# Patient Record
Sex: Male | Born: 1980 | Race: Black or African American | Hispanic: No | Marital: Married
Health system: Southern US, Academic
[De-identification: ages and names within clinical notes are randomized; demographics above are authoritative.]

## PROBLEM LIST (undated history)

## (undated) ENCOUNTER — Encounter

## (undated) ENCOUNTER — Encounter
Attending: Pharmacist Clinician (PhC)/ Clinical Pharmacy Specialist | Primary: Pharmacist Clinician (PhC)/ Clinical Pharmacy Specialist

## (undated) ENCOUNTER — Ambulatory Visit: Attending: Internal Medicine | Primary: Internal Medicine

## (undated) ENCOUNTER — Ambulatory Visit

## (undated) ENCOUNTER — Telehealth

## (undated) ENCOUNTER — Telehealth
Attending: Pharmacist Clinician (PhC)/ Clinical Pharmacy Specialist | Primary: Pharmacist Clinician (PhC)/ Clinical Pharmacy Specialist

## (undated) ENCOUNTER — Encounter: Attending: Family | Primary: Family

## (undated) ENCOUNTER — Telehealth: Attending: Family | Primary: Family

## (undated) ENCOUNTER — Encounter: Attending: Gastroenterology | Primary: Gastroenterology

## (undated) ENCOUNTER — Emergency Department (HOSPITAL_BASED_OUTPATIENT_CLINIC_OR_DEPARTMENT_OTHER): Admission: EM | Payer: Self-pay | Source: Home / Self Care

## (undated) DIAGNOSIS — I10 Essential (primary) hypertension: Secondary | ICD-10-CM

## (undated) DIAGNOSIS — K7400 Hepatic fibrosis, unspecified: Secondary | ICD-10-CM

## (undated) DIAGNOSIS — A048 Other specified bacterial intestinal infections: Secondary | ICD-10-CM

## (undated) DIAGNOSIS — B1910 Unspecified viral hepatitis B without hepatic coma: Secondary | ICD-10-CM

## (undated) HISTORY — DX: Other specified bacterial intestinal infections: A04.8

## (undated) HISTORY — DX: Hepatic fibrosis: K74.0

## (undated) HISTORY — PX: NO PAST SURGERIES: SHX2092

## (undated) HISTORY — DX: Hepatic fibrosis, unspecified: K74.00

## (undated) HISTORY — DX: Unspecified viral hepatitis B without hepatic coma: B19.10

---

## 2005-06-11 ENCOUNTER — Encounter: Admission: RE | Admit: 2005-06-11 | Discharge: 2005-06-11 | Payer: Self-pay | Admitting: Internal Medicine

## 2005-09-17 ENCOUNTER — Ambulatory Visit: Payer: Self-pay | Admitting: Gastroenterology

## 2005-12-11 ENCOUNTER — Ambulatory Visit: Payer: Self-pay | Admitting: Gastroenterology

## 2006-02-16 ENCOUNTER — Encounter: Admission: RE | Admit: 2006-02-16 | Discharge: 2006-02-16 | Payer: Self-pay | Admitting: Internal Medicine

## 2006-05-21 ENCOUNTER — Ambulatory Visit: Payer: Self-pay | Admitting: Gastroenterology

## 2006-05-28 ENCOUNTER — Ambulatory Visit (HOSPITAL_COMMUNITY): Admission: RE | Admit: 2006-05-28 | Discharge: 2006-05-28 | Payer: Self-pay | Admitting: Gastroenterology

## 2006-11-02 ENCOUNTER — Ambulatory Visit: Payer: Self-pay | Admitting: Gastroenterology

## 2006-12-17 ENCOUNTER — Ambulatory Visit (HOSPITAL_COMMUNITY): Admission: RE | Admit: 2006-12-17 | Discharge: 2006-12-17 | Payer: Self-pay | Admitting: Gastroenterology

## 2007-06-21 ENCOUNTER — Ambulatory Visit: Payer: Self-pay | Admitting: Gastroenterology

## 2007-07-08 ENCOUNTER — Emergency Department (HOSPITAL_COMMUNITY): Admission: EM | Admit: 2007-07-08 | Discharge: 2007-07-08 | Payer: Self-pay | Admitting: Emergency Medicine

## 2007-08-01 ENCOUNTER — Ambulatory Visit (HOSPITAL_COMMUNITY): Admission: RE | Admit: 2007-08-01 | Discharge: 2007-08-01 | Payer: Self-pay | Admitting: Gastroenterology

## 2008-03-08 ENCOUNTER — Ambulatory Visit: Payer: Self-pay | Admitting: Gastroenterology

## 2010-03-18 ENCOUNTER — Ambulatory Visit (HOSPITAL_BASED_OUTPATIENT_CLINIC_OR_DEPARTMENT_OTHER): Admission: RE | Admit: 2010-03-18 | Discharge: 2010-03-18 | Payer: Self-pay | Admitting: Internal Medicine

## 2010-03-18 ENCOUNTER — Ambulatory Visit: Payer: Self-pay | Admitting: Diagnostic Radiology

## 2010-06-02 ENCOUNTER — Emergency Department (HOSPITAL_COMMUNITY): Admission: EM | Admit: 2010-06-02 | Discharge: 2010-06-02 | Payer: Self-pay | Admitting: Emergency Medicine

## 2010-06-10 ENCOUNTER — Ambulatory Visit: Payer: Self-pay | Admitting: Oncology

## 2010-07-03 ENCOUNTER — Emergency Department (HOSPITAL_BASED_OUTPATIENT_CLINIC_OR_DEPARTMENT_OTHER)
Admission: EM | Admit: 2010-07-03 | Discharge: 2010-07-03 | Payer: Self-pay | Source: Home / Self Care | Admitting: Emergency Medicine

## 2011-10-31 ENCOUNTER — Ambulatory Visit (INDEPENDENT_AMBULATORY_CARE_PROVIDER_SITE_OTHER): Payer: 59 | Admitting: Internal Medicine

## 2011-10-31 VITALS — BP 134/95 | HR 76 | Temp 98.5°F | Resp 18 | Ht 69.0 in | Wt 212.0 lb

## 2011-10-31 DIAGNOSIS — N342 Other urethritis: Secondary | ICD-10-CM

## 2011-10-31 DIAGNOSIS — Z6831 Body mass index (BMI) 31.0-31.9, adult: Secondary | ICD-10-CM

## 2011-10-31 DIAGNOSIS — N341 Nonspecific urethritis: Secondary | ICD-10-CM

## 2011-10-31 LAB — POCT URINALYSIS DIPSTICK
Bilirubin, UA: NEGATIVE
Blood, UA: NEGATIVE
Glucose, UA: NEGATIVE
Ketones, UA: NEGATIVE
Leukocytes, UA: NEGATIVE
Nitrite, UA: NEGATIVE
Urobilinogen, UA: 0.2
pH, UA: 8.5

## 2011-10-31 LAB — POCT UA - MICROSCOPIC ONLY
Bacteria, U Microscopic: NEGATIVE
Casts, Ur, LPF, POC: NEGATIVE
Crystals, Ur, HPF, POC: NEGATIVE
Mucus, UA: NEGATIVE
RBC, urine, microscopic: NEGATIVE
Yeast, UA: NEGATIVE

## 2011-10-31 MED ORDER — DOXYCYCLINE HYCLATE 100 MG PO TABS: 100.0000 mg | ORAL_TABLET | Freq: Two times a day (BID) | ORAL | Status: AC

## 2011-10-31 NOTE — Progress Notes (Signed)
  Subjective:    Patient ID: Isaac Brown, male    DOB: 18-Sep-1980, 31 y.o.   MRN: [id number]  HPIShe is a 10 day history of a burning sensation in the distal urethra that increases after urination and with ejaculation/he is married and has had no other partners in the last 5-7 years/there's no discharge/there's no urinary frequency/no hematuria/no testicular pain/no skin lesions/no systemic arthritis    Review of Systems     Objective:   Physical Exam Vital signs are stable The skin of the penis is clear/testicles nontender/the meatus is clear/there is no discharge/there is no specific tenderness in the shaft      Results for orders placed in visit on 10/31/11  POCT UA - MICROSCOPIC ONLY      Component Value Range   WBC, Ur, HPF, POC 0-1     RBC, urine, microscopic negative     Bacteria, U Microscopic negative     Mucus, UA negative     Epithelial cells, urine per micros 0-2     Crystals, Ur, HPF, POC negative     Casts, Ur, LPF, POC negative     Yeast, UA negative    POCT URINALYSIS DIPSTICK      Component Value Range   Color, UA yellow     Clarity, UA clear     Glucose, UA negative     Bilirubin, UA negative     Ketones, UA negative     Spec Grav, UA 1.015     Blood, UA negative     pH, UA 8.5     Protein, UA 30     Urobilinogen, UA 0.2     Nitrite, UA negative     Leukocytes, UA Negative      Assessment & Plan:  Problem #1 urethritis of unclear etiology Doxycycline 100 twice a day #20 Send URiprobe If all is negative and he has no response to medication for a nonspecific cause, we will set up an appointment for urology

## 2011-11-04 LAB — GC/CHLAMYDIA PROBE AMP, URINE
Chlamydia, Swab/Urine, PCR: NEGATIVE
GC Probe Amp, Urine: NEGATIVE

## 2012-02-18 ENCOUNTER — Ambulatory Visit (INDEPENDENT_AMBULATORY_CARE_PROVIDER_SITE_OTHER): Payer: 59 | Admitting: Family Medicine

## 2012-02-18 ENCOUNTER — Other Ambulatory Visit: Payer: Self-pay | Admitting: Family Medicine

## 2012-02-18 VITALS — BP 132/84 | HR 88 | Temp 98.2°F | Resp 18 | Ht 69.5 in | Wt 214.0 lb

## 2012-02-18 DIAGNOSIS — J029 Acute pharyngitis, unspecified: Secondary | ICD-10-CM

## 2012-02-18 DIAGNOSIS — B1910 Unspecified viral hepatitis B without hepatic coma: Secondary | ICD-10-CM

## 2012-02-18 LAB — POCT CBC
Granulocyte percent: 49.2 %G (ref 37–80)
HCT, POC: 49.8 % (ref 43.5–53.7)
Hemoglobin: 15.6 g/dL (ref 14.1–18.1)
Lymph, poc: 1.4 (ref 0.6–3.4)
MCH, POC: 28 pg (ref 27–31.2)
MCHC: 31.3 g/dL — AB (ref 31.8–35.4)
MCV: 89.2 fL (ref 80–97)
MPV: 10.1 fL (ref 0–99.8)
POC Granulocyte: 1.8 — AB (ref 2–6.9)
POC LYMPH PERCENT: 39.4 %L (ref 10–50)
Platelet Count, POC: 206 10*3/uL (ref 142–424)
RBC: 5.58 M/uL (ref 4.69–6.13)
RDW, POC: 14 %
WBC: 3.6 10*3/uL — AB (ref 4.6–10.2)

## 2012-02-18 LAB — COMPREHENSIVE METABOLIC PANEL
ALT: 31 U/L (ref 0–53)
AST: 29 U/L (ref 0–37)
Albumin: 4.5 g/dL (ref 3.5–5.2)
Alkaline Phosphatase: 47 U/L (ref 39–117)
BUN: 12 mg/dL (ref 6–23)
Calcium: 9.3 mg/dL (ref 8.4–10.5)
Creat: 1.14 mg/dL (ref 0.50–1.35)
Glucose, Bld: 99 mg/dL (ref 70–99)
Sodium: 138 mEq/L (ref 135–145)
Total Bilirubin: 1.9 mg/dL — ABNORMAL HIGH (ref 0.3–1.2)
Total Protein: 7.3 g/dL (ref 6.0–8.3)

## 2012-02-18 LAB — POCT RAPID STREP A (OFFICE): Rapid Strep A Screen: NEGATIVE

## 2012-02-18 NOTE — Progress Notes (Signed)
[facility] [address] [place] [address] [phone]  Date:  02/18/2012   Name:  Isaac Brown   DOB:  Jul 21, 1981   MRN:  [id number]  PCP:  No primary provider on file.    Chief Complaint: Sore Throat and Fatigue   History of Present Illness:  Isaac Brown is a 31 y.o. very pleasant male patient who presents with the following:  He has noted a waxing and waning ST for 2 or 3 weeks.  He has not noted a fever, no chills, no aches.  He has felt tired. He has noted some ear aches, occasional cough with phelegm. No GI symptoms.    He does have chronic Hep B.  He was being followed by hepatology for some time, but has not been seen in about a year.  He contracted hep B through vertical transmission  Patient Active Problem List  Diagnosis  . BMI 31.0-31.9,adult    No past medical history on file.  No past surgical history on file.  History  Substance Use Topics  . Smoking status: Never Smoker   . Smokeless tobacco: Not on file  . Alcohol Use: Not on file    No family history on file.  No Known Allergies  Medication list has been reviewed and updated.  Current Outpatient Prescriptions on File Prior to Visit  Medication Sig Dispense Refill  . atenolol (TENORMIN) 25 MG tablet Take 25 mg by mouth daily.        Review of Systems:  As per HPI- otherwise negative.   Physical Examination: Filed Vitals:   02/18/12 0933  BP: 132/84  Pulse: 88  Temp: 98.2 F (36.8 C)  Resp: 18   Filed Vitals:   02/18/12 0933  Height: 5' 9.5" (1.765 m)  Weight: 214 lb (97.07 kg)   Body mass index is 31.15 kg/(m^2). Ideal Body Weight: Weight in (lb) to have BMI = 25: 171.4   GEN: WDWN, NAD, Non-toxic, A & O x 3 HEENT: Atraumatic, Normocephalic. Neck supple. No masses, No LAD.  Mild scleral icterus, oropharynx wnl, TM wnl, PEERL, EOMI Ears and Nose: No external deformity. CV: RRR, No M/G/R. No JVD. No thrill. No extra heart sounds. PULM: CTA B, no  wheezes, crackles, rhonchi. No retractions. No resp. distress. No accessory muscle use. ABD: S, NT, ND, +BS. No rebound. No HSM. EXTR: No c/c/e NEURO Normal gait.  PSYCH: Normally interactive. Conversant. Not depressed or anxious appearing.  Calm demeanor.   Results for orders placed in visit on 02/18/12  POCT CBC      Component Value Range   WBC 3.6 (*) 4.6 - 10.2 K/uL   Lymph, poc 1.4  0.6 - 3.4   POC LYMPH PERCENT 39.4  10 - 50 %L   MID (cbc) 0.4  0 - 0.9   POC MID % 11.4  0 - 12 %M   POC Granulocyte 1.8 (*) 2 - 6.9   Granulocyte percent 49.2  37 - 80 %G   RBC 5.58  4.69 - 6.13 M/uL   Hemoglobin 15.6  14.1 - 18.1 g/dL   HCT, POC 29.5  62.1 - 53.7 %   MCV 89.2  80 - 97 fL   MCH, POC 28.0  27 - 31.2 pg   MCHC 31.3 (*) 31.8 - 35.4 g/dL   RDW, POC 30.8     Platelet Count, POC 206  142 - 424 K/uL   MPV 10.1  0 - 99.8 fL  POCT RAPID STREP A (OFFICE)      Component Value Range   Rapid Strep A Screen Negative  Negative      Assessment and Plan: 1. Sore throat  POCT CBC, POCT rapid strep A, Epstein-Barr virus VCA antibody panel [[facility]], Culture, Group A Strep  2. Hepatitis B  Comprehensive metabolic panel   Suspect a viral ST- went over supportive measures.  Will contact with EBV and throat culture results. I will get him back in with ID or hepatology once results received.    Abbe Amsterdam, MD

## 2012-02-19 LAB — EPSTEIN-BARR VIRUS VCA ANTIBODY PANEL
EBV EA IgG: 20.2 U/mL — ABNORMAL HIGH (ref <9.0)
EBV NA IgG: 186 U/mL — ABNORMAL HIGH (ref <18.0)
EBV VCA IgG: 663 U/mL — ABNORMAL HIGH (ref <18.0)

## 2012-02-20 LAB — CULTURE, GROUP A STREP: Organism ID, Bacteria: NORMAL

## 2012-02-21 LAB — BILIRUBIN,DIRECT & INDIRECT (FRACTIONATED): Bilirubin, Direct: 0.3 mg/dL (ref 0.0–0.3)

## 2012-02-21 LAB — BILIRUBIN, TOTAL: Total Bilirubin: 1.9 mg/dL — ABNORMAL HIGH (ref 0.3–1.2)

## 2012-02-23 ENCOUNTER — Encounter: Payer: Self-pay | Admitting: Family Medicine

## 2012-02-23 NOTE — Addendum Note (Signed)
Addended by: Abbe Amsterdam C on: 02/23/2012 01:43 PM   Modules accepted: Orders

## 2012-03-15 ENCOUNTER — Encounter: Payer: Self-pay | Admitting: Internal Medicine

## 2012-03-15 ENCOUNTER — Ambulatory Visit (INDEPENDENT_AMBULATORY_CARE_PROVIDER_SITE_OTHER): Payer: Self-pay | Admitting: Internal Medicine

## 2012-03-15 VITALS — BP 145/97 | HR 57 | Temp 98.0°F | Wt 217.0 lb

## 2012-03-15 DIAGNOSIS — I10 Essential (primary) hypertension: Secondary | ICD-10-CM

## 2012-03-15 DIAGNOSIS — B181 Chronic viral hepatitis B without delta-agent: Secondary | ICD-10-CM

## 2012-03-15 LAB — HEPATITIS B SURFACE ANTIBODY,QUALITATIVE: Hep B S Ab: NEGATIVE

## 2012-03-15 LAB — CBC WITH DIFFERENTIAL/PLATELET
Basophils Absolute: 0 10*3/uL (ref 0.0–0.1)
Eosinophils Absolute: 0.1 10*3/uL (ref 0.0–0.7)
Hemoglobin: 15.8 g/dL (ref 13.0–17.0)
Lymphocytes Relative: 48 % — ABNORMAL HIGH (ref 12–46)
Lymphs Abs: 1.4 10*3/uL (ref 0.7–4.0)
MCH: 28.7 pg (ref 26.0–34.0)
MCV: 82.4 fL (ref 78.0–100.0)
Monocytes Absolute: 0.3 10*3/uL (ref 0.1–1.0)
Monocytes Relative: 11 % (ref 3–12)
Neutrophils Relative %: 38 % — ABNORMAL LOW (ref 43–77)
Platelets: 170 10*3/uL (ref 150–400)
RBC: 5.5 MIL/uL (ref 4.22–5.81)
RDW: 14.3 % (ref 11.5–15.5)
WBC: 2.9 10*3/uL — ABNORMAL LOW (ref 4.0–10.5)

## 2012-03-15 LAB — HIV ANTIBODY (ROUTINE TESTING W REFLEX): HIV: NONREACTIVE

## 2012-03-15 LAB — HEPATITIS C ANTIBODY: HCV Ab: NEGATIVE

## 2012-03-15 NOTE — Progress Notes (Signed)
INFECTIOUS DISEASES CLINIC  RFV: hep B management Subjective:    Patient ID: Isaac Brown, male    DOB: 02-03-1981, 31 y.o.   MRN: [id number]  HPI Isaac Brown is a 31 yo MAle, originally from [place], moved to [place] 10 years ago. Who has history of hepatitis B, and hypertension. He was referred by his PCP. He states that he occasionally feels fatigue. occ bilateral flank pain. Increased weight 7 lb over the last 3-4 months. In setting of stopping working out since back to school. He reports having episode of scleral icterus. He was diagnosed while in high school, while trying to donate blood in [place].  Current Outpatient Prescriptions on File Prior to Visit  Medication Sig Dispense Refill  . atenolol (TENORMIN) 25 MG tablet Take 25 mg by mouth daily.       Active Ambulatory Problems    Diagnosis Date Noted  . BMI 31.0-31.9,adult 10/31/2011   Resolved Ambulatory Problems    Diagnosis Date Noted  . No Resolved Ambulatory Problems   No Additional Past Medical History     Soc hx: works as Estate agent. Separated now, but wife and 5yo daughter in [place]. Also sister lives in [place]. Going school at [employer] in [place]. No smoking or drinking  family history is not on file.  Review of Systems     Objective:   Physical Exam BP 145/97  Pulse 57  Temp 98 F (36.7 C) (Oral)  Wt 217 lb (98.431 kg) Physical Exam  Constitutional: He is oriented to person, place, and time. He appears well-developed and well-nourished. No distress.  HENT:  Mouth/Throat: Oropharynx is clear and moist. No oropharyngeal exudate.  Cardiovascular: Normal rate, regular rhythm and normal heart sounds. Exam reveals no gallop and no friction rub.  No murmur heard.  Pulmonary/Chest: Effort normal and breath sounds normal. No respiratory distress. He has no wheezes.  Abdominal: Soft. Bowel sounds are normal. He exhibits no distension. There is no tenderness.  Lymphadenopathy:  He has no cervical adenopathy.    Neurological: He is alert and oriented to person, place, and time.  Skin: Skin is warm and dry. No rash noted. No erythema.  Psychiatric: He has a normal mood and affect. His behavior is normal.   Labs: hepBsAg POSITIVE hepBcAb POSITIVE Hep BsAb NEGATIVE Hep A Ab POSITIVE Hep C NEGATIVE HIV NEGATIVE     Assessment & Plan:  History of chronic hepatitis B= no records, will check the following labs:  hep a, hep b, hep c, and hiv. Will check hep B viral load if needed. May need to get liver ultrasound but will wait to see back results  Addendum. Will check Hep B VL, and will arrange for RUQ ultrasound with dopplers. Pending these results will see if we need to do patient assistance program to get access to antiviral therapy  rtc in 4 wks

## 2012-03-16 LAB — HEPATITIS A ANTIBODY, TOTAL: Hep A Total Ab: POSITIVE — AB

## 2012-03-16 LAB — HEPATITIS B CORE ANTIBODY, TOTAL: Hep B Core Total Ab: POSITIVE — AB

## 2012-03-17 ENCOUNTER — Ambulatory Visit: Payer: Self-pay | Admitting: Internal Medicine

## 2012-03-21 DIAGNOSIS — I10 Essential (primary) hypertension: Secondary | ICD-10-CM | POA: Insufficient documentation

## 2012-03-21 DIAGNOSIS — B181 Chronic viral hepatitis B without delta-agent: Secondary | ICD-10-CM | POA: Insufficient documentation

## 2012-04-04 ENCOUNTER — Other Ambulatory Visit: Payer: Self-pay | Admitting: Internal Medicine

## 2012-04-04 ENCOUNTER — Other Ambulatory Visit: Payer: Self-pay

## 2012-04-04 DIAGNOSIS — B1910 Unspecified viral hepatitis B without hepatic coma: Secondary | ICD-10-CM

## 2012-04-04 NOTE — Addendum Note (Signed)
Addended by: Mariea Clonts D on: 04/04/2012 04:04 PM   Modules accepted: Orders

## 2012-04-05 ENCOUNTER — Telehealth: Payer: Self-pay | Admitting: *Deleted

## 2012-04-05 NOTE — Telephone Encounter (Signed)
Called patient to inform him of his ultrasound appt. His appointment is set for Thursday 04/07/12 at 930 at [facility]. Advised him to have nothing to eat after midnight and to arrive 15 min early also left clinic number and advised him to call if he has any questions as i had to leave the message on his voice mail. Will try to call him again tomorrow 04/06/12 to make sure he got the information.

## 2012-04-07 ENCOUNTER — Ambulatory Visit (HOSPITAL_COMMUNITY)
Admission: RE | Admit: 2012-04-07 | Discharge: 2012-04-07 | Disposition: A | Discharge status: Home / Self Care | Payer: 59 | Source: Ambulatory Visit | Attending: Internal Medicine | Admitting: Internal Medicine

## 2012-04-07 ENCOUNTER — Other Ambulatory Visit: Payer: Self-pay | Admitting: Internal Medicine

## 2012-04-07 DIAGNOSIS — B1910 Unspecified viral hepatitis B without hepatic coma: Secondary | ICD-10-CM

## 2012-04-11 LAB — HEPATITIS B DNA, ULTRAQUANTITATIVE, PCR
Hepatitis B DNA (Calc): 7578 copies/mL — ABNORMAL HIGH (ref <116)
Hepatitis B DNA: 1302 IU/mL — ABNORMAL HIGH (ref <20)

## 2012-04-19 ENCOUNTER — Encounter: Payer: Self-pay | Admitting: Internal Medicine

## 2012-04-19 ENCOUNTER — Ambulatory Visit (INDEPENDENT_AMBULATORY_CARE_PROVIDER_SITE_OTHER): Payer: 59 | Admitting: Internal Medicine

## 2012-04-19 VITALS — BP 154/90 | HR 76 | Temp 98.1°F | Wt 217.0 lb

## 2012-04-19 DIAGNOSIS — B181 Chronic viral hepatitis B without delta-agent: Secondary | ICD-10-CM

## 2012-04-19 DIAGNOSIS — Z23 Encounter for immunization: Secondary | ICD-10-CM

## 2012-04-19 NOTE — Progress Notes (Signed)
INFECTIOUS DISEASES CLINIC NOTE Subjective:    Patient ID: Isaac Brown, male    DOB: Nov 04, 1980, 31 y.o.   MRN: [id number]  HPI 31yo Male with chronic hepatitis B, VL 7,500. Doing well since last visit. Came to clinic to review results from his recent visit. He is happy to hear that his HIV tests are negative. In addition his RUQ u/s did not show signs for cirrhosis.  He is doing well, eating well, no fever, chills, nightsweats, abdominal pain, jaundice  Current Outpatient Prescriptions on File Prior to Visit  Medication Sig Dispense Refill  . atenolol (TENORMIN) 25 MG tablet Take 25 mg by mouth daily.       Active Ambulatory Problems    Diagnosis Date Noted  . BMI 31.0-31.9,adult 10/31/2011  . Chronic hepatitis B 03/21/2012  . Hypertension 03/21/2012   Resolved Ambulatory Problems    Diagnosis Date Noted  . No Resolved Ambulatory Problems   No Additional Past Medical History       Review of Systems Review of Systems  Constitutional: Negative for fever, chills, diaphoresis, activity change, appetite change, fatigue and unexpected weight change.  HENT: Negative for congestion, sore throat, rhinorrhea, sneezing, trouble swallowing and sinus pressure.  Eyes: Negative for photophobia and visual disturbance.  Respiratory: Negative for cough, chest tightness, shortness of breath, wheezing and stridor.  Cardiovascular: Negative for chest pain, palpitations and leg swelling.  Gastrointestinal: Negative for nausea, vomiting, abdominal pain, diarrhea, constipation, blood in stool, abdominal distention and anal bleeding.  Genitourinary: Negative for dysuria, hematuria, flank pain and difficulty urinating.  Musculoskeletal: Negative for myalgias, back pain, joint swelling, arthralgias and gait problem.  Skin: Negative for color change, pallor, rash and wound.  Neurological: Negative for dizziness, tremors, weakness and light-headedness.  Hematological: Negative for adenopathy. Does not  bruise/bleed easily.  Psychiatric/Behavioral: Negative for behavioral problems, confusion, sleep disturbance, dysphoric mood, decreased concentration and agitation.       Objective:   Physical Exam  BP 154/90  Pulse 76  Temp 98.1 F (36.7 C) (Oral)  Wt 217 lb (98.431 kg) Physical Exam  Constitutional: He is oriented to person, place, and time. He appears well-developed and well-nourished. No distress.  HENT:  Mouth/Throat: Oropharynx is clear and moist. No oropharyngeal exudate.  Cardiovascular: Normal rate, regular rhythm and normal heart sounds. Exam reveals no gallop and no friction rub.  No murmur heard.  Pulmonary/Chest: Effort normal and breath sounds normal. No respiratory distress. He has no wheezes.  Abdominal: Soft. Bowel sounds are normal. He exhibits no distension. There is no tenderness.  Lymphadenopathy:  He has no cervical adenopathy.  Neurological: He is alert and oriented to person, place, and time.  Skin: Skin is warm and dry. No rash noted. No erythema.  Psychiatric: He has a normal mood and affect. His behavior is normal.         Assessment & Plan:  - will check hep B e antigen at labs next week, he is late for work so appt cut short - health maintenance = gave flu shot at this visit - follow up in 4-6 months to follow up on chornic hep b

## 2012-08-17 ENCOUNTER — Ambulatory Visit: Payer: 59 | Admitting: Internal Medicine

## 2012-08-22 ENCOUNTER — Ambulatory Visit: Payer: Self-pay | Admitting: Internal Medicine

## 2012-08-22 ENCOUNTER — Telehealth: Payer: Self-pay | Admitting: *Deleted

## 2012-08-22 NOTE — Telephone Encounter (Signed)
Left generic message on patient's voicemail notifying him of his missed appointment, and asking him to call and reschedule at his earliest convenience. Andree Coss, RN

## 2014-01-10 ENCOUNTER — Other Ambulatory Visit: Payer: Self-pay | Admitting: Ophthalmology

## 2014-01-10 DIAGNOSIS — R51 Headache: Principal | ICD-10-CM

## 2014-01-10 DIAGNOSIS — R519 Headache, unspecified: Secondary | ICD-10-CM

## 2014-01-10 DIAGNOSIS — H5713 Ocular pain, bilateral: Secondary | ICD-10-CM

## 2014-01-11 ENCOUNTER — Other Ambulatory Visit: Payer: 59

## 2014-01-17 ENCOUNTER — Ambulatory Visit
Admission: RE | Admit: 2014-01-17 | Discharge: 2014-01-17 | Disposition: A | Discharge status: Home / Self Care | Payer: 59 | Source: Ambulatory Visit | Attending: Ophthalmology | Admitting: Ophthalmology

## 2014-01-17 DIAGNOSIS — R51 Headache: Principal | ICD-10-CM

## 2014-01-17 DIAGNOSIS — R519 Headache, unspecified: Secondary | ICD-10-CM

## 2016-12-14 DIAGNOSIS — R7303 Prediabetes: Secondary | ICD-10-CM | POA: Diagnosis not present

## 2016-12-14 DIAGNOSIS — K219 Gastro-esophageal reflux disease without esophagitis: Secondary | ICD-10-CM | POA: Diagnosis not present

## 2016-12-14 DIAGNOSIS — I119 Hypertensive heart disease without heart failure: Secondary | ICD-10-CM | POA: Diagnosis not present

## 2016-12-14 DIAGNOSIS — Z0000 Encounter for general adult medical examination without abnormal findings: Secondary | ICD-10-CM | POA: Diagnosis not present

## 2016-12-14 DIAGNOSIS — Z131 Encounter for screening for diabetes mellitus: Secondary | ICD-10-CM | POA: Diagnosis not present

## 2016-12-14 DIAGNOSIS — Z01118 Encounter for examination of ears and hearing with other abnormal findings: Secondary | ICD-10-CM | POA: Diagnosis not present

## 2016-12-14 DIAGNOSIS — Z7689 Persons encountering health services in other specified circumstances: Secondary | ICD-10-CM | POA: Diagnosis not present

## 2017-06-11 DIAGNOSIS — R7303 Prediabetes: Secondary | ICD-10-CM | POA: Diagnosis not present

## 2017-06-11 DIAGNOSIS — I119 Hypertensive heart disease without heart failure: Secondary | ICD-10-CM | POA: Diagnosis not present

## 2017-06-11 DIAGNOSIS — I10 Essential (primary) hypertension: Secondary | ICD-10-CM | POA: Diagnosis not present

## 2017-06-28 DIAGNOSIS — I119 Hypertensive heart disease without heart failure: Secondary | ICD-10-CM | POA: Diagnosis not present

## 2017-07-08 DIAGNOSIS — I10 Essential (primary) hypertension: Secondary | ICD-10-CM | POA: Diagnosis not present

## 2017-07-08 DIAGNOSIS — R7303 Prediabetes: Secondary | ICD-10-CM | POA: Diagnosis not present

## 2017-07-08 DIAGNOSIS — I119 Hypertensive heart disease without heart failure: Secondary | ICD-10-CM | POA: Diagnosis not present

## 2017-08-14 ENCOUNTER — Emergency Department (HOSPITAL_BASED_OUTPATIENT_CLINIC_OR_DEPARTMENT_OTHER): Payer: 59

## 2017-08-14 ENCOUNTER — Encounter (HOSPITAL_BASED_OUTPATIENT_CLINIC_OR_DEPARTMENT_OTHER): Payer: Self-pay | Admitting: Emergency Medicine

## 2017-08-14 ENCOUNTER — Emergency Department (HOSPITAL_BASED_OUTPATIENT_CLINIC_OR_DEPARTMENT_OTHER)
Admission: EM | Admit: 2017-08-14 | Discharge: 2017-08-14 | Disposition: A | Discharge status: Home / Self Care | Payer: 59 | Attending: Emergency Medicine | Admitting: Emergency Medicine

## 2017-08-14 ENCOUNTER — Other Ambulatory Visit: Payer: Self-pay

## 2017-08-14 DIAGNOSIS — Y998 Other external cause status: Secondary | ICD-10-CM | POA: Diagnosis not present

## 2017-08-14 DIAGNOSIS — Y929 Unspecified place or not applicable: Secondary | ICD-10-CM | POA: Diagnosis not present

## 2017-08-14 DIAGNOSIS — Z79899 Other long term (current) drug therapy: Secondary | ICD-10-CM | POA: Diagnosis not present

## 2017-08-14 DIAGNOSIS — S61212A Laceration without foreign body of right middle finger without damage to nail, initial encounter: Secondary | ICD-10-CM | POA: Diagnosis not present

## 2017-08-14 DIAGNOSIS — I10 Essential (primary) hypertension: Secondary | ICD-10-CM | POA: Diagnosis not present

## 2017-08-14 DIAGNOSIS — S62660A Nondisplaced fracture of distal phalanx of right index finger, initial encounter for closed fracture: Secondary | ICD-10-CM | POA: Diagnosis not present

## 2017-08-14 DIAGNOSIS — S6991XA Unspecified injury of right wrist, hand and finger(s), initial encounter: Secondary | ICD-10-CM

## 2017-08-14 DIAGNOSIS — W231XXA Caught, crushed, jammed, or pinched between stationary objects, initial encounter: Secondary | ICD-10-CM | POA: Insufficient documentation

## 2017-08-14 DIAGNOSIS — S61311A Laceration without foreign body of left index finger with damage to nail, initial encounter: Secondary | ICD-10-CM | POA: Diagnosis not present

## 2017-08-14 DIAGNOSIS — R600 Localized edema: Secondary | ICD-10-CM | POA: Diagnosis not present

## 2017-08-14 DIAGNOSIS — Z23 Encounter for immunization: Secondary | ICD-10-CM | POA: Insufficient documentation

## 2017-08-14 DIAGNOSIS — Y9389 Activity, other specified: Secondary | ICD-10-CM | POA: Diagnosis not present

## 2017-08-14 HISTORY — DX: Essential (primary) hypertension: I10

## 2017-08-14 MED ORDER — TETANUS-DIPHTH-ACELL PERTUSSIS 5-2.5-18.5 LF-MCG/0.5 IM SUSP
0.5000 mL | Freq: Once | INTRAMUSCULAR | Status: AC
  Administered 2017-08-14: 0.5 mL via INTRAMUSCULAR
  Filled 2017-08-14: qty 0.5

## 2017-08-14 NOTE — ED Provider Notes (Signed)
[facility] EMERGENCY DEPARTMENT Provider Note   CSN: [id number] Arrival date & time: 08/14/17  [id number]     History   Chief Complaint Chief Complaint  Patient presents with  . Hand Injury    HPI Isaac Brown is a 37 y.o. male.  Patient had his right hand fingers smashed in a door just prior to arrival.  Patient is right-hand dominant.  States tetanus not up-to-date.  This did not occur at work.      Past Medical History:  Diagnosis Date  . Hypertension     Patient Active Problem List   Diagnosis Date Noted  . Chronic hepatitis B (HCC) 03/21/2012  . Hypertension 03/21/2012  . BMI 31.0-31.9,adult 10/31/2011    History reviewed. No pertinent surgical history.     Home Medications    Prior to Admission medications   Medication Sig Start Date End Date Taking? Authorizing Provider  atenolol (TENORMIN) 25 MG tablet Take 25 mg by mouth daily.    [provider]    Family History No family history on file.  Social History Social History   Tobacco Use  . Smoking status: Never Smoker  . Smokeless tobacco: Never Used  Substance Use Topics  . Alcohol use: No  . Drug use: No     Allergies   Patient has no known allergies.   Review of Systems Review of Systems  Constitutional: Negative for fever.  HENT: Negative for congestion.   Eyes: Negative for redness.  Respiratory: Negative for shortness of breath.   Cardiovascular: Negative for chest pain.  Gastrointestinal: Negative for abdominal pain.  Musculoskeletal: Negative for neck pain.  Skin: Positive for wound.  Neurological: Negative for syncope.  Hematological: Does not bruise/bleed easily.  Psychiatric/Behavioral: Negative for confusion.     Physical Exam Updated Vital Signs BP (!) 139/92 (BP Location: Left Arm)   Temp 98 F (36.7 C) (Oral)   Resp 18   Ht 1.778 m (5\' 10" )   Wt 95.3 kg (210 lb)   SpO2 100%   BMI 30.13 kg/m   Physical Exam  Constitutional: He is  oriented to person, place, and time. He appears well-developed and well-nourished. No distress.  HENT:  Head: Normocephalic and atraumatic.  Eyes: Conjunctivae and EOM are normal. Pupils are equal, round, and reactive to light.  Neck: Neck supple.  Cardiovascular: Normal rate, regular rhythm and normal heart sounds.  Pulmonary/Chest: Effort normal and breath sounds normal.  Abdominal: Soft. Bowel sounds are normal.  Musculoskeletal:  Left hand normal.  Right hand with tenderness to the distal index finger.  Nail partially avulsed.  Good range of motion of the finger.  Cap refill intact.  Middle finger with a skin tear on the dorsum distal phalanx not involving the nail bed.  Good cap refill good range of motion of the finger.  Neurological: He is alert and oriented to person, place, and time. No cranial nerve deficit or sensory deficit. He exhibits normal muscle tone. Coordination normal.  Skin: Skin is warm.  Nursing note and vitals reviewed.    ED Treatments / Results  Labs (all labs ordered are listed, but only abnormal results are displayed) Labs Reviewed - No data to display  EKG  EKG Interpretation None       Radiology Dg Finger Index Right  Result Date: 08/14/2017 CLINICAL DATA:  Slammed finger in door EXAM: RIGHT SECOND FINGER 2+V COMPARISON:  None. FINDINGS: Frontal, oblique, and lateral views were obtained. There is an obliquely oriented fracture  through the distal aspect of the second distal phalanx with alignment anatomic. No other fracture. No dislocation. Joint spaces appear normal. No erosive change. IMPRESSION: Nondisplaced obliquely oriented fracture distal aspect second distal phalanx. No other fracture. No dislocation. No evident arthropathy. Electronically Signed   By: Bretta Bang III M.D.   On: 08/14/2017 07:31   Dg Finger Middle Right  Result Date: 08/14/2017 CLINICAL DATA:  Slammed finger in door EXAM: RIGHT THIRD FINGER 2+V COMPARISON:  None. FINDINGS:  Frontal, oblique and lateral views obtained. There is soft tissue injury dorsal to the third DIP joint. Radiography does not show obvious ungual disruption. No acute fracture or dislocation. Joint spaces appear normal. No erosive change. IMPRESSION: Soft tissue injury with edema dorsal to the third DIP joint. No obvious ungual disruption by radiography. No fracture or dislocation. No evident arthropathy. Electronically Signed   By: Bretta Bang III M.D.   On: 08/14/2017 07:32    Procedures Procedures (including critical care time)  Medications Ordered in ED Medications  Tdap (BOOSTRIX) injection 0.5 mL (0.5 mLs Intramuscular Given 08/14/17 0731)     Initial Impression / Assessment and Plan / ED Course  I have reviewed the triage vital signs and the nursing notes.  Pertinent labs & imaging results that were available during my care of the patient were reviewed by me and considered in my medical decision making (see chart for details).    Patient with injury to his right index and middle finger.  Patient is right-hand dominant.  Tetanus updated here.  X-rays show a distal phalanx tuft blight fracture this nondisplaced.  The nail is disrupted on the index finger and probably will come off but used as a biological dressing for now and that finger was dressed with bacitracin ointment nonadherent dressing and gauze wrap and finger splint.  Avulsion skin tear to the dorsum distal aspect of the middle finger nail intact.  No deep laceration the skin was placed back over as a biological dressing bacitracin ointment applied nonadherent dressing as well as a hemostatic dressing.  Wrapped in gauze and taped.  Final Clinical Impressions(s) / ED Diagnoses   Final diagnoses:  Injury of finger of right hand, initial encounter  Laceration of right middle finger without foreign body without damage to nail, initial encounter  Laceration of left index finger without foreign body with damage to nail,  initial encounter  Closed nondisplaced fracture of distal phalanx of right index finger, initial encounter    ED Discharge Orders    None       Vanetta Mulders, MD 08/14/17 [phone]

## 2017-08-14 NOTE — Discharge Instructions (Signed)
Your tetanus was updated.  Will expect that over time the right index finger nail will eventually come off and a new nail will grow in its place.  You have a fracture to the distal index finger.  Skin tear over the distal middle finger will heal over time.  Keep the current dressings and splint in place until Monday and he can take them off wash gently with soap and water reapply antibiotic ointment and apply large Band-Aids and put the splint back on the index finger.  Make an appointment to follow-up with your regular doctor.  Return for any new or worse symptoms.  Recommend elevating the right hand is much as possible.  Can take Motrin or Naprosyn for pain.

## 2017-08-14 NOTE — ED Triage Notes (Signed)
Pt shut right hand in car door, laceration to right middle finger noted and pain to index finger.

## 2017-09-01 ENCOUNTER — Ambulatory Visit (INDEPENDENT_AMBULATORY_CARE_PROVIDER_SITE_OTHER): Payer: 59

## 2017-09-01 ENCOUNTER — Ambulatory Visit (INDEPENDENT_AMBULATORY_CARE_PROVIDER_SITE_OTHER): Payer: 59 | Admitting: Emergency Medicine

## 2017-09-01 ENCOUNTER — Encounter: Payer: Self-pay | Admitting: Emergency Medicine

## 2017-09-01 ENCOUNTER — Other Ambulatory Visit: Payer: Self-pay

## 2017-09-01 VITALS — BP 104/70 | HR 76 | Temp 98.4°F | Resp 16 | Ht 69.25 in | Wt 220.2 lb

## 2017-09-01 DIAGNOSIS — R1084 Generalized abdominal pain: Secondary | ICD-10-CM

## 2017-09-01 DIAGNOSIS — Z298 Encounter for other specified prophylactic measures: Secondary | ICD-10-CM | POA: Diagnosis not present

## 2017-09-01 DIAGNOSIS — Z2989 Encounter for other specified prophylactic measures: Secondary | ICD-10-CM | POA: Insufficient documentation

## 2017-09-01 DIAGNOSIS — Z23 Encounter for immunization: Secondary | ICD-10-CM | POA: Diagnosis not present

## 2017-09-01 DIAGNOSIS — B181 Chronic viral hepatitis B without delta-agent: Secondary | ICD-10-CM | POA: Diagnosis not present

## 2017-09-01 LAB — POCT URINALYSIS DIP (MANUAL ENTRY)
Bilirubin, UA: NEGATIVE
Blood, UA: NEGATIVE
Glucose, UA: NEGATIVE mg/dL
Ketones, POC UA: NEGATIVE mg/dL
LEUKOCYTES UA: NEGATIVE
NITRITE UA: NEGATIVE
PH UA: 5.5 (ref 5.0–8.0)
Protein Ur, POC: NEGATIVE mg/dL
Spec Grav, UA: 1.02 (ref 1.010–1.025)
UROBILINOGEN UA: 0.2 U/dL

## 2017-09-01 LAB — COMPREHENSIVE METABOLIC PANEL
ALT: 31 IU/L (ref 0–44)
AST: 31 IU/L (ref 0–40)
Albumin/Globulin Ratio: 1.5 (ref 1.2–2.2)
Albumin: 4.7 g/dL (ref 3.5–5.5)
Alkaline Phosphatase: 74 IU/L (ref 39–117)
BUN/Creatinine Ratio: 14 (ref 9–20)
BUN: 17 mg/dL (ref 6–20)
Bilirubin Total: 1 mg/dL (ref 0.0–1.2)
CALCIUM: 9.2 mg/dL (ref 8.7–10.2)
CO2: 25 mmol/L (ref 20–29)
CREATININE: 1.25 mg/dL (ref 0.76–1.27)
Chloride: 100 mmol/L (ref 96–106)
GFR calc Af Amer: 84 mL/min/{1.73_m2} (ref >59)
GFR, EST NON AFRICAN AMERICAN: 73 mL/min/{1.73_m2} (ref >59)
GLOBULIN, TOTAL: 3.2 g/dL (ref 1.5–4.5)
GLUCOSE: 101 mg/dL — AB (ref 65–99)
Potassium: 4.3 mmol/L (ref 3.5–5.2)
Sodium: 141 mmol/L (ref 134–144)
Total Protein: 7.9 g/dL (ref 6.0–8.5)

## 2017-09-01 LAB — POCT CBC
Granulocyte percent: 55.2 %G (ref 37–80)
HCT, POC: 47.9 % (ref 43.5–53.7)
HEMOGLOBIN: 15.6 g/dL (ref 14.1–18.1)
LYMPH, POC: 1.3 (ref 0.6–3.4)
MCH, POC: 28 pg (ref 27–31.2)
MCHC: 32.6 g/dL (ref 31.8–35.4)
MCV: 85.8 fL (ref 80–97)
MID (CBC): 0.3 (ref 0–0.9)
MPV: 8.4 fL (ref 0–99.8)
POC Granulocyte: 1.9 — AB (ref 2–6.9)
POC LYMPH PERCENT: 37.2 %L (ref 10–50)
POC MID %: 7.6 %M (ref 0–12)
Platelet Count, POC: 200 10*3/uL (ref 142–424)
RBC: 5.58 M/uL (ref 4.69–6.13)
RDW, POC: 13.8 %
WBC: 3.4 10*3/uL — AB (ref 4.6–10.2)

## 2017-09-01 MED ORDER — DOXYCYCLINE HYCLATE 100 MG PO TABS: 100.0000 mg | ORAL_TABLET | Freq: Every day | ORAL | 1 refills | Status: DC

## 2017-09-01 MED ORDER — DOXYCYCLINE HYCLATE 100 MG PO TABS
100.0000 mg | ORAL_TABLET | Freq: Every day | ORAL | 1 refills | Status: DC
Start: 2017-09-01 — End: 2018-01-25

## 2017-09-01 MED ORDER — HYOSCYAMINE SULFATE SL 0.125 MG SL SUBL: 1.0000 | SUBLINGUAL_TABLET | Freq: Four times a day (QID) | SUBLINGUAL | 3 refills | Status: DC | PRN

## 2017-09-01 MED ORDER — OMEPRAZOLE 40 MG PO CPDR: 40.0000 mg | DELAYED_RELEASE_CAPSULE | Freq: Every day | ORAL | 3 refills | Status: DC

## 2017-09-01 NOTE — Patient Instructions (Addendum)
     IF you received an x-ray today, you will receive an invoice from [facility]. Please contact [facility] at [phone] with questions or concerns regarding your invoice.   IF you received labwork today, you will receive an invoice from [facility]. Please contact [facility] at [phone] with questions or concerns regarding your invoice.   Our billing staff will not be able to assist you with questions regarding bills from these companies.  You will be contacted with the lab results as soon as they are available. The fastest way to get your results is to activate your My Chart account. Instructions are located on the last page of this paperwork. If you have not heard from us regarding the results in 2 weeks, please contact this office.      Abdominal Pain, Adult Many things can cause belly (abdominal) pain. Most times, belly pain is not dangerous. Many cases of belly pain can be watched and treated at home. Sometimes belly pain is serious, though. Your doctor will try to find the cause of your belly pain. Follow these instructions at home:  Take over-the-counter and prescription medicines only as told by your doctor. Do not take medicines that help you poop (laxatives) unless told to by your doctor.  Drink enough fluid to keep your pee (urine) clear or pale yellow.  Watch your belly pain for any changes.  Keep all follow-up visits as told by your doctor. This is important. Contact a doctor if:  Your belly pain changes or gets worse.  You are not hungry, or you lose weight without trying.  You are having trouble pooping (constipated) or have watery poop (diarrhea) for more than 2-3 days.  You have pain when you pee or poop.  Your belly pain wakes you up at night.  Your pain gets worse with meals, after eating, or with certain foods.  You are throwing up and cannot keep anything down.  You have a fever. Get help right away if:  Your pain does not go  away as soon as your doctor says it should.  You cannot stop throwing up.  Your pain is only in areas of your belly, such as the right side or the left lower part of the belly.  You have bloody or black poop, or poop that looks like tar.  You have very bad pain, cramping, or bloating in your belly.  You have signs of not having enough fluid or water in your body (dehydration), such as: ? Dark pee, very little pee, or no pee. ? Cracked lips. ? Dry mouth. ? Sunken eyes. ? Sleepiness. ? Weakness. This information is not intended to replace advice given to you by your health care provider. Make sure you discuss any questions you have with your health care provider. Document Released: 12/30/2007 Document Revised: 01/31/2016 Document Reviewed: 12/25/2015 Elsevier Interactive Patient Education  2018 [employer]  

## 2017-09-01 NOTE — Progress Notes (Signed)
Isaac Brown 37 y.o.   Chief Complaint  Patient presents with  . Abdominal Pain    LEFT side per patient x 1 month  . Back Pain    x 1 month    HISTORY OF PRESENT ILLNESS: This is a 37 y.o. male with a history of chronic hepatitis B complaining of left-sided abdominal pain, mostly upper, on and off for 1 month.  Still able to eat and drink.  Denies nausea or vomiting.  Denies fever or chills.  Denies diarrhea.  Able to move his bowels.  No blood in the stools.  No other significant symptoms. Also requesting antimalarial medications.  Traveling to [place] next week and planning on staying there for 1 week.  Abdominal Pain  This is a new problem. The current episode started more than 1 month ago. The problem occurs intermittently. The problem has been waxing and waning. The pain is located in the LUQ, LLQ and epigastric region. The pain is at a severity of 5/10. The pain is moderate (No pain at present time). The quality of the pain is colicky and cramping. The abdominal pain does not radiate. Pertinent negatives include no anorexia, arthralgias, belching, constipation, diarrhea, dysuria, fever, flatus, frequency, headaches, hematochezia, hematuria, melena, myalgias, nausea, vomiting or weight loss. Nothing aggravates the pain. The pain is relieved by nothing. He has tried nothing for the symptoms. Prior workup: None. There is no history of abdominal surgery, colon cancer, Crohn's disease, gallstones, GERD or pancreatitis.     Prior to Admission medications   Medication Sig Start Date End Date Taking? Authorizing Provider  amLODipine-benazepril (LOTREL) 10-40 MG capsule Take 1 capsule by mouth daily.   Yes [provider]  aspirin EC 81 MG tablet Take 81 mg by mouth daily.   Yes [provider]  Cholecalciferol (VITAMIN D PO) Take 2,000 Units by mouth daily.   Yes [provider]  atenolol (TENORMIN) 25 MG tablet Take 25 mg by mouth daily.    [provider]    No Known Allergies  Patient Active Problem List   Diagnosis Date Noted  . Chronic hepatitis B (HCC) 03/21/2012  . Hypertension 03/21/2012  . BMI 31.0-31.9,adult 10/31/2011    Past Medical History:  Diagnosis Date  . Hypertension     No past surgical history on file.  Social History   Socioeconomic History  . Marital status: Single    Spouse name: Not on file  . Number of children: Not on file  . Years of education: Not on file  . Highest education level: Not on file  Social Needs  . Financial resource strain: Not on file  . Food insecurity - worry: Not on file  . Food insecurity - inability: Not on file  . Transportation needs - medical: Not on file  . Transportation needs - non-medical: Not on file  Occupational History  . Not on file  Tobacco Use  . Smoking status: Never Smoker  . Smokeless tobacco: Never Used  Substance and Sexual Activity  . Alcohol use: No  . Drug use: No  . Sexual activity: Yes    Partners: Female  Other Topics Concern  . Not on file  Social History Narrative  . Not on file    No family history on file.   Review of Systems  Constitutional: Negative for fever, malaise/fatigue and weight loss.  HENT: Negative.  Negative for sore throat.   Respiratory: Negative for cough and shortness of breath.   Cardiovascular: Negative.  Negative for chest pain and palpitations.  Gastrointestinal: Positive for abdominal pain. Negative for anorexia, blood in stool, constipation, diarrhea, flatus, hematochezia, melena, nausea and vomiting.  Genitourinary: Negative for dysuria, frequency and hematuria.  Musculoskeletal: Negative for arthralgias, back pain, myalgias and neck pain.  Skin: Negative for rash.  Neurological: Negative for dizziness and headaches.  Endo/Heme/Allergies: Negative.   All other systems reviewed and are negative.   Vitals:   09/01/17 0807  BP: 104/70  Pulse: 76  Resp: 16  Temp: 98.4 F (36.9 C)  SpO2: 97%     Physical Exam  Constitutional: He is oriented to person, place, and time. He appears well-developed and well-nourished.  HENT:  Head: Normocephalic and atraumatic.  Nose: Nose normal.  Mouth/Throat: Oropharynx is clear and moist.  Eyes: Conjunctivae and EOM are normal. Pupils are equal, round, and reactive to light.  Neck: Normal range of motion. Neck supple. No JVD present.  Cardiovascular: Normal rate, regular rhythm and normal heart sounds.  Pulmonary/Chest: Effort normal and breath sounds normal.  Abdominal: Soft. Bowel sounds are normal. He exhibits no distension and no mass. There is no tenderness. There is no rebound and no guarding.  Musculoskeletal: Normal range of motion.  Lymphadenopathy:    He has no cervical adenopathy.  Neurological: He is alert and oriented to person, place, and time. No sensory deficit. He exhibits normal muscle tone.  Skin: Skin is warm and dry. Capillary refill takes less than 2 seconds. No rash noted.  Psychiatric: He has a normal mood and affect. His behavior is normal.  Vitals reviewed.  Results for orders placed or performed in visit on 09/01/17 (from the past 24 hour(s))  POCT CBC     Status: Abnormal   Collection Time: 09/01/17  8:52 AM  Result Value Ref Range   WBC 3.4 (A) 4.6 - 10.2 K/uL   Lymph, poc 1.3 0.6 - 3.4   POC LYMPH PERCENT 37.2 10 - 50 %L   MID (cbc) 0.3 0 - 0.9   POC MID % 7.6 0 - 12 %M   POC Granulocyte 1.9 (A) 2 - 6.9   Granulocyte percent 55.2 37 - 80 %G   RBC 5.58 4.69 - 6.13 M/uL   Hemoglobin 15.6 14.1 - 18.1 g/dL   HCT, POC 40.947.9 81.143.5 - 53.7 %   MCV 85.8 80 - 97 fL   MCH, POC 28.0 27 - 31.2 pg   MCHC 32.6 31.8 - 35.4 g/dL   RDW, POC 91.413.8 %   Platelet Count, POC 200 142 - 424 K/uL   MPV 8.4 0 - 99.8 fL  POCT urinalysis dipstick     Status: Normal   Collection Time: 09/01/17  8:54 AM  Result Value Ref Range   Color, UA yellow yellow   Clarity, UA clear clear   Glucose, UA negative negative mg/dL   Bilirubin,  UA negative negative   Ketones, POC UA negative negative mg/dL   Spec Grav, UA 7.8291.020 5.6211.010 - 1.025   Blood, UA negative negative   pH, UA 5.5 5.0 - 8.0   Protein Ur, POC negative negative mg/dL   Urobilinogen, UA 0.2 0.2 or 1.0 E.U./dL   Nitrite, UA Negative Negative   Leukocytes, UA Negative Negative   Dg Abd Acute W/chest  Result Date: 09/01/2017 CLINICAL DATA:  Generalized abdominal pain for 1 month. EXAM: DG ABDOMEN ACUTE W/ 1V CHEST COMPARISON:  None. FINDINGS: There is no evidence of dilated bowel loops or free intraperitoneal air. No radiopaque calculi  or other significant radiographic abnormality is seen. Heart size and mediastinal contours are within normal limits. Both lungs are clear. IMPRESSION: No evidence of bowel obstruction or ileus. No acute cardiopulmonary disease. Electronically Signed   By: Lupita Raider, M.D.   On: 09/01/2017 09:03     ASSESSMENT & PLAN: Liban was seen today for abdominal pain and back pain.  Diagnoses and all orders for this visit:  Generalized abdominal pain -     POCT CBC -     POCT urinalysis dipstick -     DG Abd Acute W/Chest; Future -     Comprehensive metabolic panel -     Hyoscyamine Sulfate SL (LEVSIN/SL) 0.125 MG SUBL; Place 1 tablet under the tongue every 6 (six) hours as needed. -     omeprazole (PRILOSEC) 40 MG capsule; Take 1 capsule (40 mg total) by mouth daily.  Chronic hepatitis B (HCC)  Need for malaria prophylaxis -     Discontinue: doxycycline (VIBRA-TABS) 100 MG tablet; Take 1 tablet (100 mg total) by mouth daily. Start taking 1-2 days before traveling, daily while in [place], and then daily x 4 weeks after returning. -     doxycycline (VIBRA-TABS) 100 MG tablet; Take 1 tablet (100 mg total) by mouth daily. Start taking 1-2 days before traveling, daily while in [place], and then daily x 4 weeks after returning.    Patient Instructions       IF you received an x-ray today, you will receive an invoice from [facility]. Please contact [facility] at [phone] with questions or concerns regarding your invoice.   IF you received labwork today, you will receive an invoice from [facility]. Please contact [facility] at [phone] with questions or concerns regarding your invoice.   Our billing staff will not be able to assist you with questions regarding bills from these companies.  You will be contacted with the lab results as soon as they are available. The fastest way to get your results is to activate your My Chart account. Instructions are located on the last page of this paperwork. If you have not heard from [place] regarding the results in 2 weeks, please contact this office.     Abdominal Pain, Adult Many things can cause belly (abdominal) pain. Most times, belly pain is not dangerous. Many cases of belly pain can be watched and treated at home. Sometimes belly pain is serious, though. Your doctor will try to find the cause of your belly pain. Follow these instructions at home:  Take over-the-counter and prescription medicines only as told by your doctor. Do not take medicines that help you poop (laxatives) unless told to by your doctor.  Drink enough fluid to keep your pee (urine) clear or pale yellow.  Watch your belly pain for any changes.  Keep all follow-up visits as told by your doctor. This is important. Contact a doctor if:  Your belly pain changes or gets worse.  You are not hungry, or you lose weight without trying.  You are having trouble pooping (constipated) or have watery poop (diarrhea) for more than 2-3 days.  You have pain when you pee or poop.  Your belly pain wakes you up at night.  Your pain gets worse with meals, after eating, or with certain foods.  You are throwing up and cannot keep anything down.  You have a fever. Get help right away if:  Your pain does not go away as soon as your doctor says it should.  You cannot stop throwing up.  Your pain  is only in areas of your belly, such as the right side or the left lower part of the belly.  You have bloody or black poop, or poop that looks like tar.  You have very bad pain, cramping, or bloating in your belly.  You have signs of not having enough fluid or water in your body (dehydration), such as: ? Dark pee, very little pee, or no pee. ? Cracked lips. ? Dry mouth. ? Sunken eyes. ? Sleepiness. ? Weakness. This information is not intended to replace advice given to you by your health care provider. Make sure you discuss any questions you have with your health care provider. Document Released: 12/30/2007 Document Revised: 01/31/2016 Document Reviewed: 12/25/2015 Elsevier Interactive Patient Education  2018 Elsevier Inc.      Edwina Barth, MD [facility]

## 2017-09-01 NOTE — Addendum Note (Signed)
Addended by: Oneida AlarPETERSON, Alexsia Klindt on: 09/01/2017 09:36 AM   Modules accepted: Orders

## 2017-09-03 ENCOUNTER — Encounter: Payer: Self-pay | Admitting: Radiology

## 2017-12-31 DIAGNOSIS — Z131 Encounter for screening for diabetes mellitus: Secondary | ICD-10-CM | POA: Diagnosis not present

## 2017-12-31 DIAGNOSIS — Z136 Encounter for screening for cardiovascular disorders: Secondary | ICD-10-CM | POA: Diagnosis not present

## 2017-12-31 DIAGNOSIS — Z0000 Encounter for general adult medical examination without abnormal findings: Secondary | ICD-10-CM | POA: Diagnosis not present

## 2017-12-31 DIAGNOSIS — I10 Essential (primary) hypertension: Secondary | ICD-10-CM | POA: Diagnosis not present

## 2017-12-31 DIAGNOSIS — Z01118 Encounter for examination of ears and hearing with other abnormal findings: Secondary | ICD-10-CM | POA: Diagnosis not present

## 2018-01-05 ENCOUNTER — Telehealth: Payer: Self-pay | Admitting: Hematology

## 2018-01-05 ENCOUNTER — Encounter: Payer: Self-pay | Admitting: Hematology

## 2018-01-05 NOTE — Telephone Encounter (Signed)
New referral received from Dr. Julio Sickssei-Bonsu for dx of leukopenia. Pt as been scheduled to see Dr. Mosetta PuttFeng on 7/2 at 815am. Pt aware to arrive 30 minutes early. Letter mailed.

## 2018-01-19 NOTE — Progress Notes (Signed)
[place]  [phone] Fax:([phone]) [place] consult Note   Patient Care Team: Carlyle Basques, MD as PCP - General (Infectious Diseases) 01/25/2018  REFERRAL PHYSICIAN: Benito Mccreedy, MD   CHIEF COMPLAINTS/PURPOSE OF CONSULTATION:  Leukopenia  HISTORY OF PRESENTING ILLNESS:  Isaac Brown 37 y.o. male is here because of leukopenia. He was referred on 01/05/2018 by Dr. Vista Lawman, a PCP. On 09/01/2017, Dr. Agustina Caroli states that Pt. Is a known case of chronic hepatitis B. He travelled to [place] in February 2019, and has taken antimalarial medications. According to CBC done by his PCP on 01/25/2018, his WBC count was 3.4 with ANC 1.9.  The rest of CBC was unremarkable.  He has chronic hepatitis B, was previous seen by infectious disease Dr. Baxter Flattery, his WBC was 2.95 years ago, he was not aware he had leukopenia at that time.   Today he feels well. He denies recent infections like pneumonia and UTI. He thinks that his hepatitis B is most likely transmitted through birth from his mother and states that he diagnosed after attempting to donate blood years ago. He states that he was never hospitalized for his hepatitis. He is originally from [place].  He is a hepatitis B carrier. His wife and children were vaccinated and do not have the disease.  He is trying to lose weight by changing diet, but needs more advice and assistance.  He also complains for frequent urination at night. He wakes up 2-3 times at night to urinate, and states that he consumes fluids before bedtime. He sometimes feels burning sensation at the end or urination. He denies day frequency, hesitation, weak stream, or hesitancy.  He denies personal or family history or autoimmune disorders, rashes, joint disorders, or cancer. He denies low grade fever, night sweats, chills, or weight changes.  He has no significant past medical or family history. His BP today is high at 158/94, that he  relates to visiting the hospital, as his BP is normal at home. No smoking, alcohol, or illicit drug use. He works at [employer] for a living. He lives in [place], and has relatives in the area.      MEDICAL HISTORY:  Past Medical History:  Diagnosis Date  . Hypertension     SURGICAL HISTORY: History reviewed. No pertinent surgical history.  SOCIAL HISTORY: Social History   Socioeconomic History  . Marital status: Single    Spouse name: Not on file  . Number of children: Not on file  . Years of education: Not on file  . Highest education level: Not on file  Occupational History  . Not on file  Social Needs  . Financial resource strain: Not on file  . Food insecurity:    Worry: Not on file    Inability: Not on file  . Transportation needs:    Medical: Not on file    Non-medical: Not on file  Tobacco Use  . Smoking status: Never Smoker  . Smokeless tobacco: Never Used  Substance and Sexual Activity  . Alcohol use: No  . Drug use: No  . Sexual activity: Yes    Partners: Female  Lifestyle  . Physical activity:    Days per week: Not on file    Minutes per session: Not on file  . Stress: Not on file  Relationships  . Social connections:    Talks on phone: Not on file    Gets together: Not on file    Attends religious service: Not on  file    Active member of club or organization: Not on file    Attends meetings of clubs or organizations: Not on file    Relationship status: Not on file  . Intimate partner violence:    Fear of current or ex partner: Not on file    Emotionally abused: Not on file    Physically abused: Not on file    Forced sexual activity: Not on file  Other Topics Concern  . Not on file  Social History Narrative  . Not on file    FAMILY HISTORY: Family History  Problem Relation Age of Onset  . Diabetes Mother     ALLERGIES:  has No Known Allergies.  MEDICATIONS:  Current Outpatient Medications  Medication Sig Dispense Refill  .  amLODipine-benazepril (LOTREL) 10-40 MG capsule Take 1 capsule by mouth daily.    Marland Kitchen aspirin EC 81 MG tablet Take 81 mg by mouth daily.    . Cholecalciferol (VITAMIN D PO) Take 2,000 Units by mouth daily.     No current facility-administered medications for this visit.     REVIEW OF SYSTEMS:  Constitutional: Denies fevers, chills or abnormal night sweats Eyes: Denies blurriness of vision, double vision or watery eyes Ears, nose, mouth, throat, and face: Denies mucositis or sore throat Respiratory: Denies cough, dyspnea or wheezes Cardiovascular: Denies palpitation, chest discomfort or lower extremity swelling Gastrointestinal:  Denies nausea, heartburn or change in bowel habits Genitourinary: No weak stream, hesitancy, straining. No hematuria(+) Frequent urination at night (wakes up 2-3 times) (+) occasional dysuria at the end of urination Skin: Denies abnormal skin rashes Lymphatics: Denies new lymphadenopathy or easy bruising Neurological:Denies numbness, tingling or new weaknesses Behavioral/Psych: Mood is stable, no new changes  All other systems were reviewed with the patient and are negative.  PHYSICAL EXAMINATION: ECOG PERFORMANCE STATUS: 0 - Asymptomatic  Vitals:   01/25/18 0825  BP: (!) 158/94  Pulse: 79  Resp: 18  Temp: 98.8 F (37.1 C)  SpO2: 100%   Filed Weights   01/25/18 0825  Weight: 215 lb 9.6 oz (97.8 kg)    GENERAL:alert, no distress and comfortable SKIN: skin color, texture, turgor are normal, no rashes or significant lesions EYES: normal, conjunctiva are pink and non-injected, sclera clear OROPHARYNX:no exudate, no erythema and lips, buccal mucosa, and tongue normal  NECK: supple, thyroid normal size, non-tender, without nodularity LYMPH:  no palpable lymphadenopathy in the cervical, axillary or inguinal LUNGS: clear to auscultation and percussion with normal breathing effort HEART: regular rate & rhythm and no murmurs and no lower extremity  edema ABDOMEN:abdomen soft, non-tender and normal bowel sounds. No hepatosplenomegaly. Musculoskeletal:no cyanosis of digits and no clubbing  PSYCH: alert & oriented x 3 with fluent speech NEURO: no focal motor/sensory deficits  LABORATORY DATA:  I have reviewed the data as listed CBC Latest Ref Rng & Units 01/25/2018 09/01/2017 03/15/2012  WBC 4.0 - 10.3 K/uL 2.8(L) 3.4(A) 2.9(L)  Hemoglobin 13.0 - 17.1 g/dL 14.9 15.6 15.8  Hematocrit 38.4 - 49.9 % 42.4 47.9 45.3  Platelets 140 - 400 K/uL 162 - 170    CMP Latest Ref Rng & Units 09/01/2017 02/18/2012 02/18/2012  Glucose 65 - 99 mg/dL 101(H) - 99  BUN 6 - 20 mg/dL 17 - 12  Creatinine 0.76 - 1.27 mg/dL 1.25 - 1.14  Sodium 134 - 144 mmol/L 141 - 138  Potassium 3.5 - 5.2 mmol/L 4.3 - 4.5  Chloride 96 - 106 mmol/L 100 - 102  CO2 20 - 29  mmol/L 25 - 30  Calcium 8.7 - 10.2 mg/dL 9.2 - 9.3  Total Protein 6.0 - 8.5 g/dL 7.9 - 7.3  Total Bilirubin 0.0 - 1.2 mg/dL 1.0 1.9(H) 1.9(H)  Alkaline Phos 39 - 117 IU/L 74 - 47  AST 0 - 40 IU/L 31 - 29  ALT 0 - 44 IU/L 31 - 31     RADIOGRAPHIC STUDIES: I have personally reviewed the radiological images as listed and agreed with the findings in the report. No results found.  ASSESSMENT & PLAN:  Isaac Brown is a 37 y.o. male from [place], with past medical history of chronic hepatitis B infection, untreated, presented with chronic leukopenia.  1. 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 Baxter Flattery for hepatitis B infection.  No history of recurrent infection.  Asymptomatic. -We discussed with the etiology of neutropenia, including hepatitis B related, splenomegaly, folate or B12 deficiency, autoimmune related, or related to his ethnic group.  Some African-American people have borderline low leukopenia and  neutropenia, which is normal to them.  He does not drink alcohol, HIV and hepatitis C was tested negative before, I do not see any of his medications can cause neutropenia.  I think he is neutropenia is likely related to his chronic hepatitis B, or normal variant due to his ethnic group.  -Giving his young age,  the borderline leukopenia, no recurrent infection, CBC otherwise normal, clinically asymptomatic, I do not think this is primary bone marrow disease, such as MDS, no clinical suspicion for lymphoma or leukemia.  I do not think he needs a bone marrow biopsy. -Given the mild neutropenia, he does not need any treatment for now.  I recommend continue medical monitoring.  We discussed infection precautions. -I strongly encouraged him to see Dr. Baxter Flattery back for his hepatitis B  -I will obtain a abdominal ultrasound to monitor his liver and spleen -lab and f/u in 6 months, if his counts are stable, I will probably see him once a year   2.  Chronic hepatitis B, untreated -He was last seen by Dr. Graylon Good in 2013, treatment was not offered at that time. -I encouraged him to follow-up with Dr. Graylon Good, will refer him back   3. HTN -Follow-up with primary care physician and continue medication.   Plan: -Lab today -Abdominal US in 1-2 weeks -Lab and f/u in 6 months  -refer back to Dr. Baxter Flattery   No problem-specific Assessment & Plan notes found for this encounter.    All questions were answered. The patient knows to call the clinic with any problems, questions or concerns. I spent 25 minutes counseling the patient face to face. The total time spent in the appointment was 30 minutes and more than 50% was on counseling.  Dierdre Searles Dweik am acting as scribe for Dr. Truitt Merle.  I have reviewed the above documentation for accuracy and completeness, and I agree with the above.      Truitt Merle, MD 01/25/2018

## 2018-01-25 ENCOUNTER — Inpatient Hospital Stay: Payer: 59

## 2018-01-25 ENCOUNTER — Inpatient Hospital Stay: Payer: 59 | Attending: Hematology | Admitting: Hematology

## 2018-01-25 ENCOUNTER — Encounter: Payer: Self-pay | Admitting: Hematology

## 2018-01-25 ENCOUNTER — Telehealth: Payer: Self-pay

## 2018-01-25 VITALS — BP 158/94 | HR 79 | Temp 98.8°F | Resp 18 | Ht 69.25 in | Wt 215.6 lb

## 2018-01-25 DIAGNOSIS — D709 Neutropenia, unspecified: Secondary | ICD-10-CM | POA: Diagnosis not present

## 2018-01-25 DIAGNOSIS — B181 Chronic viral hepatitis B without delta-agent: Secondary | ICD-10-CM | POA: Diagnosis not present

## 2018-01-25 DIAGNOSIS — D72819 Decreased white blood cell count, unspecified: Secondary | ICD-10-CM | POA: Insufficient documentation

## 2018-01-25 DIAGNOSIS — I10 Essential (primary) hypertension: Secondary | ICD-10-CM | POA: Diagnosis not present

## 2018-01-25 LAB — CBC WITH DIFFERENTIAL (CANCER CENTER ONLY)
BASOS ABS: 0 10*3/uL (ref 0.0–0.1)
Basophils Relative: 0 %
EOS PCT: 2 %
Eosinophils Absolute: 0.1 10*3/uL (ref 0.0–0.5)
HEMATOCRIT: 42.4 % (ref 38.4–49.9)
Hemoglobin: 14.9 g/dL (ref 13.0–17.1)
LYMPHS ABS: 1.1 10*3/uL (ref 0.9–3.3)
LYMPHS PCT: 40 %
MCH: 28.8 pg (ref 27.2–33.4)
MCHC: 35.1 g/dL (ref 32.0–36.0)
MCV: 82 fL (ref 79.3–98.0)
MONO ABS: 0.3 10*3/uL (ref 0.1–0.9)
Monocytes Relative: 11 %
NEUTROS ABS: 1.3 10*3/uL — AB (ref 1.5–6.5)
Neutrophils Relative %: 47 %
Platelet Count: 162 10*3/uL (ref 140–400)
RBC: 5.17 MIL/uL (ref 4.20–5.82)
RDW: 13.5 % (ref 11.0–14.6)
WBC Count: 2.8 10*3/uL — ABNORMAL LOW (ref 4.0–10.3)

## 2018-01-25 LAB — FOLATE: Folate: 11.8 ng/mL (ref >5.9)

## 2018-01-25 LAB — SEDIMENTATION RATE: SED RATE: 2 mm/h (ref 0–16)

## 2018-01-25 LAB — VITAMIN B12: VITAMIN B 12: 535 pg/mL (ref 180–914)

## 2018-01-25 LAB — PATHOLOGIST SMEAR REVIEW

## 2018-01-25 NOTE — Telephone Encounter (Signed)
Printed avs and calender of upcoming appointment. Per 7/2 los. Added name to referral dashboard, and lab add on for today

## 2018-01-28 ENCOUNTER — Other Ambulatory Visit: Payer: Self-pay | Admitting: Hematology

## 2018-01-28 DIAGNOSIS — B181 Chronic viral hepatitis B without delta-agent: Secondary | ICD-10-CM

## 2018-01-29 LAB — METHYLMALONIC ACID, SERUM: Methylmalonic Acid, Quantitative: 92 nmol/L (ref 0–378)

## 2018-02-04 ENCOUNTER — Ambulatory Visit (HOSPITAL_COMMUNITY)
Admission: RE | Admit: 2018-02-04 | Discharge: 2018-02-04 | Disposition: A | Discharge status: Home / Self Care | Payer: 59 | Source: Ambulatory Visit | Attending: Hematology | Admitting: Hematology

## 2018-02-04 DIAGNOSIS — B181 Chronic viral hepatitis B without delta-agent: Secondary | ICD-10-CM | POA: Insufficient documentation

## 2018-02-04 DIAGNOSIS — K739 Chronic hepatitis, unspecified: Secondary | ICD-10-CM | POA: Diagnosis not present

## 2018-02-07 ENCOUNTER — Ambulatory Visit (INDEPENDENT_AMBULATORY_CARE_PROVIDER_SITE_OTHER): Payer: 59 | Admitting: Family

## 2018-02-07 ENCOUNTER — Encounter: Payer: Self-pay | Admitting: Family

## 2018-02-07 VITALS — BP 143/84 | HR 87 | Temp 98.2°F | Wt 215.0 lb

## 2018-02-07 DIAGNOSIS — B181 Chronic viral hepatitis B without delta-agent: Secondary | ICD-10-CM | POA: Diagnosis not present

## 2018-02-07 NOTE — Patient Instructions (Signed)
Nice to meet you!  We will check your blood work and schedule you for an ultrasound.   Please limit your Tylenol (acetaminophen) and alcohol consumption/usage.   We will plan follow up and need for treatment pending lab and imaging results.

## 2018-02-07 NOTE — Assessment & Plan Note (Signed)
Isaac [name] appears to have Chronic Hepatitis B that he believes he received from his mother. He is in need of blood work to determine his current stage and need for treatment. He did complete an ultrasound of the abdomen last week with no significant findings, however elastography is also needed. He appears to be asymptomatic currently. Discussed goals of monitoring and treatment pending imaging and blood work. Plan for follow up pending results.

## 2018-02-07 NOTE — Progress Notes (Signed)
Subjective:    Patient ID: Isaac [name], male    DOB: Dec 26, 1980, 37 y.o.   MRN: [id number]  Chief Complaint  Patient presents with  . Hepatitis B    HPI:  Isaac [name] is a 37 y.o. male who presents today for an initial office visit for evaluation and treatment of hepatitis B.  Isaac [name] was previously seen in our office by Dr. Drue [name] in August 2013. [name] is originally from [place] and moved to [place] about 16 years ago. Initially diagnosed while in high school when [name] was trying to donate blood. [name] completed an ultrasound with no evidence of cirrhosis and found to have a viral load of 7,500. Since that time [name] was lost to follow up.   [name] is newly referred from his Hematologist whom [name] was being seen for leukopenia. Believes that his Hepatitis B was likely transmitted from his mother. His wife and children do not have Hepatitis B. Believed that his chronic neutropenia was likely related to his chronic Hepaitis B or a normal varient due to his ethnic group. [name] has no current Hepatitis B lab work that is available for review.   [name] reports eating well with no fever, chills, night sweats, fatigue, abdominal pain, or jaundice.   No Known Allergies    Outpatient Medications Prior to Visit  Medication Sig Dispense Refill  . amLODipine-benazepril (LOTREL) 10-40 MG capsule Take 1 capsule by mouth daily.    Marland Kitchen aspirin EC 81 MG tablet Take 81 mg by mouth daily.    . Cholecalciferol (VITAMIN D PO) Take 2,000 Units by mouth daily.     No facility-administered medications prior to visit.      Past Medical History:  Diagnosis Date  . Hepatitis B   . Hypertension       History reviewed. No pertinent surgical history.    Family History  Problem Relation Age of Onset  . Diabetes Mother   . Hepatitis B Mother       Social History   Socioeconomic History  . Marital status: Married    Spouse name: Not on file  . Number of children: 2  . Years of education: Not on file    . Highest education level: Not on file  Occupational History  . Not on file  Social Needs  . Financial resource strain: Not on file  . Food insecurity:    Worry: Not on file    Inability: Not on file  . Transportation needs:    Medical: Not on file    Non-medical: Not on file  Tobacco Use  . Smoking status: Never Smoker  . Smokeless tobacco: Never Used  Substance and Sexual Activity  . Alcohol use: No  . Drug use: No  . Sexual activity: Yes    Partners: Female  Lifestyle  . Physical activity:    Days per week: Not on file    Minutes per session: Not on file  . Stress: Not on file  Relationships  . Social connections:    Talks on phone: Not on file    Gets together: Not on file    Attends religious service: Not on file    Active member of club or organization: Not on file    Attends meetings of clubs or organizations: Not on file    Relationship status: Not on file  . Intimate partner violence:    Fear of current or ex partner: Not on file    Emotionally abused: Not on  file    Physically abused: Not on file    Forced sexual activity: Not on file  Other Topics Concern  . Not on file  Social History Narrative  . Not on file      Review of Systems  Constitutional: Negative for chills, diaphoresis, fatigue and fever.  Respiratory: Negative for cough, chest tightness, shortness of breath and wheezing.   Cardiovascular: Negative for chest pain.  Gastrointestinal: Negative for abdominal distention, abdominal pain, constipation, diarrhea, nausea and vomiting.  Neurological: Negative for weakness and headaches.  Hematological: Does not bruise/bleed easily.       Objective:    BP (!) 143/84   Pulse 87   Temp 98.2 F (36.8 C) (Oral)   Wt 215 lb (97.5 kg)   BMI 31.52 kg/m  Nursing note and vital signs reviewed.  Physical Exam  Constitutional: [name] is oriented to person, place, and time. [name] appears well-developed and well-nourished. No distress.  Cardiovascular:  Normal rate, regular rhythm, normal heart sounds and intact distal pulses. Exam reveals no gallop and no friction rub.  No murmur heard. Pulmonary/Chest: Effort normal and breath sounds normal. No respiratory distress. [name] has no wheezes. [name] has no rales. [name] exhibits no tenderness.  Abdominal: Soft. Bowel sounds are normal. [name] exhibits no distension, no ascites and no mass. There is no hepatosplenomegaly. There is no tenderness. There is no rebound and no guarding.  Neurological: [name] is alert and oriented to person, place, and time.  Skin: Skin is warm and dry.  Psychiatric: [name] has a normal mood and affect.        Assessment & Plan:   Problem List Items Addressed This Visit      Digestive   Chronic hepatitis B (HCC) - Primary    Isaac [name] appears to have Chronic Hepatitis B that [name] believes [name] received from his mother. [name] is in need of blood work to determine his current stage and need for treatment. [name] did complete an ultrasound of the abdomen last week with no significant findings, however elastography is also needed. [name] appears to be asymptomatic currently. Discussed goals of monitoring and treatment pending imaging and blood work. Plan for follow up pending results.       Relevant Orders   US ABDOMEN COMPLETE W/ELASTOGRAPHY   Hepatitis B core antibody, total   Hepatitis B DNA, ultraquantitative, PCR   Hepatitis B e antibody   Hepatitis B e antigen   Hepatitis B surface antigen   Hepatitis B surface antibody   Hepatitis B core antibody, IgM   Hepatic function panel       I am having Isaac [name] maintain his amLODipine-benazepril, aspirin EC, and Cholecalciferol (VITAMIN D PO).    Follow-up: Return in about 1 month (around 03/10/2018).   Jeanine LuzGregory Calone, FNP [facility]

## 2018-02-09 ENCOUNTER — Ambulatory Visit (HOSPITAL_COMMUNITY)
Admission: RE | Admit: 2018-02-09 | Discharge: 2018-02-09 | Disposition: A | Discharge status: Home / Self Care | Payer: 59 | Source: Ambulatory Visit | Attending: Family | Admitting: Family

## 2018-02-09 DIAGNOSIS — B181 Chronic viral hepatitis B without delta-agent: Secondary | ICD-10-CM | POA: Diagnosis not present

## 2018-02-09 LAB — HEPATITIS B DNA, ULTRAQUANTITATIVE, PCR
HEPATITIS B DNA: 2200 [IU]/mL — AB
Hepatitis B DNA (Calc): 3.34 Log IU/mL — ABNORMAL HIGH

## 2018-02-11 ENCOUNTER — Telehealth: Payer: Self-pay | Admitting: Family

## 2018-02-11 DIAGNOSIS — K7400 Hepatic fibrosis, unspecified: Secondary | ICD-10-CM

## 2018-02-11 LAB — HEPATIC FUNCTION PANEL
AG Ratio: 1.5 (calc) (ref 1.0–2.5)
ALBUMIN MSPROF: 4.4 g/dL (ref 3.6–5.1)
ALT: 21 U/L (ref 9–46)
AST: 21 U/L (ref 10–40)
Alkaline phosphatase (APISO): 59 U/L (ref 40–115)
Bilirubin, Direct: 0.2 mg/dL (ref 0.0–0.2)
GLOBULIN: 2.9 g/dL (ref 1.9–3.7)
Indirect Bilirubin: 0.9 mg/dL (calc) (ref 0.2–1.2)
Total Bilirubin: 1.1 mg/dL (ref 0.2–1.2)
Total Protein: 7.3 g/dL (ref 6.1–8.1)

## 2018-02-11 LAB — HEPATITIS B E ANTIGEN: Hep B E Ag: NONREACTIVE

## 2018-02-11 LAB — HEPATITIS B SURFACE ANTIGEN: Hepatitis B Surface Ag: REACTIVE — AB

## 2018-02-11 LAB — HEPATITIS B SURFACE ANTIBODY,QUALITATIVE: HEP B S AB: NONREACTIVE

## 2018-02-11 LAB — HEPATITIS B E ANTIBODY: Hep B E Ab: REACTIVE — AB

## 2018-02-11 LAB — HEPATITIS B CORE ANTIBODY, IGM: HEP B C IGM: NONREACTIVE

## 2018-02-11 LAB — HEPATITIS B CORE ANTIBODY, TOTAL: HEP B C TOTAL AB: NONREACTIVE

## 2018-02-11 NOTE — Telephone Encounter (Signed)
Spoke with patient regarding his lab and imaging results concerning for a high risk of fibrosis. His blood work does not indicate the need to treat his Hepatitis B currently, however if cirrhosis is present than we may need to consider treatment. I will send a referral to gastroenterology for further evaluation and recommendations regarding his high risk of fibrosis.

## 2018-02-14 ENCOUNTER — Encounter: Payer: Self-pay | Admitting: Gastroenterology

## 2018-02-18 ENCOUNTER — Telehealth: Payer: Self-pay

## 2018-02-18 NOTE — Telephone Encounter (Signed)
Spoke with patient per Dr. Mosetta PuttFeng notified lab results are normal, no concerns, we will continue to monitor his leukopenia.  Patient verbalized an understanding.

## 2018-02-18 NOTE — Telephone Encounter (Signed)
-----   Message from Malachy MoodYan Feng, MD sent at 02/18/2018 11:28 AM EDT ----- Elnita Maxwellheryl, please let pt know that his lab which were done in our office 3 weeks ago were normal, no concerns, will continue monitor his leukopenia.   Thanks   Malachy MoodYan Feng  02/18/2018

## 2018-03-04 DIAGNOSIS — I119 Hypertensive heart disease without heart failure: Secondary | ICD-10-CM | POA: Diagnosis not present

## 2018-03-04 DIAGNOSIS — E782 Mixed hyperlipidemia: Secondary | ICD-10-CM | POA: Diagnosis not present

## 2018-03-04 DIAGNOSIS — I10 Essential (primary) hypertension: Secondary | ICD-10-CM | POA: Diagnosis not present

## 2018-03-30 ENCOUNTER — Encounter: Payer: 59 | Admitting: Gastroenterology

## 2018-04-13 ENCOUNTER — Encounter (INDEPENDENT_AMBULATORY_CARE_PROVIDER_SITE_OTHER): Payer: Self-pay

## 2018-04-13 ENCOUNTER — Other Ambulatory Visit (INDEPENDENT_AMBULATORY_CARE_PROVIDER_SITE_OTHER): Payer: 59

## 2018-04-13 ENCOUNTER — Encounter: Payer: Self-pay | Admitting: Gastroenterology

## 2018-04-13 ENCOUNTER — Ambulatory Visit (INDEPENDENT_AMBULATORY_CARE_PROVIDER_SITE_OTHER): Payer: 59 | Admitting: Gastroenterology

## 2018-04-13 VITALS — BP 118/78 | HR 80 | Ht 69.0 in | Wt 216.4 lb

## 2018-04-13 DIAGNOSIS — R1012 Left upper quadrant pain: Secondary | ICD-10-CM

## 2018-04-13 DIAGNOSIS — B181 Chronic viral hepatitis B without delta-agent: Secondary | ICD-10-CM

## 2018-04-13 LAB — CBC
HCT: 43.6 % (ref 39.0–52.0)
HEMOGLOBIN: 15 g/dL (ref 13.0–17.0)
MCHC: 34.4 g/dL (ref 30.0–36.0)
MCV: 83.6 fl (ref 78.0–100.0)
PLATELETS: 181 10*3/uL (ref 150.0–400.0)
RBC: 5.22 Mil/uL (ref 4.22–5.81)
RDW: 13.7 % (ref 11.5–15.5)
WBC: 3.8 10*3/uL — AB (ref 4.0–10.5)

## 2018-04-13 LAB — HEPATIC FUNCTION PANEL
ALBUMIN: 4.6 g/dL (ref 3.5–5.2)
ALK PHOS: 60 U/L (ref 39–117)
ALT: 29 U/L (ref 0–53)
AST: 35 U/L (ref 0–37)
BILIRUBIN DIRECT: 0.3 mg/dL (ref 0.0–0.3)
TOTAL PROTEIN: 7.9 g/dL (ref 6.0–8.3)
Total Bilirubin: 1.8 mg/dL — ABNORMAL HIGH (ref 0.2–1.2)

## 2018-04-13 LAB — FERRITIN: Ferritin: 161.2 ng/mL (ref 22.0–322.0)

## 2018-04-13 LAB — BILIRUBIN, DIRECT: Bilirubin, Direct: 0.3 mg/dL (ref 0.0–0.3)

## 2018-04-13 LAB — PROTIME-INR
INR: 1.2 ratio — AB (ref 0.8–1.0)
PROTHROMBIN TIME: 14 s — AB (ref 9.6–13.1)

## 2018-04-13 NOTE — Patient Instructions (Signed)
Normal BMI (Body Mass Index- based on height and weight) is between 19 and 25. Your BMI today is Body mass index is 31.95 kg/m. Marland Kitchen. Please consider follow up  regarding your BMI with your Primary Care Provider.  Your provider has requested that you go to the basement level for lab work before leaving today. Press "B" on the elevator. The lab is located at the first door on the left as you exit the elevator.  We have scheduled you for a follow up appointment with Dr Meridee ScoreMansouraty on 05/24/18 at 230pm
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
## 2018-04-15 ENCOUNTER — Other Ambulatory Visit: Payer: 59

## 2018-04-15 ENCOUNTER — Other Ambulatory Visit: Payer: Self-pay | Admitting: Gastroenterology

## 2018-04-15 ENCOUNTER — Encounter: Payer: Self-pay | Admitting: Gastroenterology

## 2018-04-15 ENCOUNTER — Other Ambulatory Visit: Payer: Self-pay

## 2018-04-15 DIAGNOSIS — B181 Chronic viral hepatitis B without delta-agent: Secondary | ICD-10-CM

## 2018-04-15 DIAGNOSIS — R1012 Left upper quadrant pain: Secondary | ICD-10-CM

## 2018-04-15 LAB — HEPATITIS B DNA, ULTRAQUANTITATIVE, PCR
HEPATITIS B DNA (CALC): 3.04 {Log_IU}/mL — AB
Hepatitis B DNA: 1090 IU/mL — ABNORMAL HIGH

## 2018-04-18 ENCOUNTER — Encounter: Payer: Self-pay | Admitting: Gastroenterology

## 2018-04-18 DIAGNOSIS — A048 Other specified bacterial intestinal infections: Secondary | ICD-10-CM | POA: Insufficient documentation

## 2018-04-18 LAB — HELICOBACTER PYLORI  SPECIAL ANTIGEN
MICRO NUMBER:: 91132410
RESULT:: DETECTED — AB
SPECIMEN QUALITY: ADEQUATE

## 2018-04-19 ENCOUNTER — Other Ambulatory Visit: Payer: Self-pay

## 2018-04-19 DIAGNOSIS — A048 Other specified bacterial intestinal infections: Secondary | ICD-10-CM

## 2018-04-19 MED ORDER — OMEPRAZOLE 40 MG PO CPDR: 40.0000 mg | DELAYED_RELEASE_CAPSULE | Freq: Two times a day (BID) | ORAL | 0 refills | Status: DC

## 2018-04-19 MED ORDER — BIS SUBCIT-METRONID-TETRACYC 140-125-125 MG PO CAPS: 3.0000 | ORAL_CAPSULE | Freq: Three times a day (TID) | ORAL | 0 refills | Status: DC

## 2018-04-21 ENCOUNTER — Telehealth: Payer: Self-pay | Admitting: Gastroenterology

## 2018-04-21 MED ORDER — BIS SUBCIT-METRONID-TETRACYC 140-125-125 MG PO CAPS: 3.0000 | ORAL_CAPSULE | Freq: Three times a day (TID) | ORAL | 0 refills | Status: DC

## 2018-04-21 NOTE — Telephone Encounter (Signed)
[facility] notified office and stated they do not carry a 14 day pack of Pylera,they only have a 10 day and cannot spilt another pack to equal 14 days. I had the order cancelled. A new prescription was sent to  [facility] who will have the medication ready for pick up on Friday as they had to place a special order for the medication. Patient was notified  of his prescription at [facility] and will pick up on Friday. Patient voiced understanding.

## 2018-05-20 ENCOUNTER — Telehealth: Payer: Self-pay | Admitting: Gastroenterology

## 2018-05-20 NOTE — Telephone Encounter (Signed)
Pt wants to know if there is a generic for pylera because copay is very high.

## 2018-05-23 ENCOUNTER — Other Ambulatory Visit: Payer: Self-pay | Admitting: Gastroenterology

## 2018-05-23 MED ORDER — TETRACYCLINE HCL 500 MG PO CAPS: 500.0000 mg | ORAL_CAPSULE | Freq: Four times a day (QID) | ORAL | 0 refills | Status: AC

## 2018-05-23 MED ORDER — BISMUTH SUBSALICYLATE 262 MG PO TABS: 2.0000 | ORAL_TABLET | Freq: Four times a day (QID) | ORAL | 0 refills | Status: AC

## 2018-05-23 MED ORDER — METRONIDAZOLE 500 MG PO TABS: 500.0000 mg | ORAL_TABLET | Freq: Four times a day (QID) | ORAL | 0 refills | Status: AC

## 2018-05-23 NOTE — Telephone Encounter (Signed)
That sounds like a good plan. Thanks. Liz Beach

## 2018-05-23 NOTE — Telephone Encounter (Signed)
Dr Meridee Score, Pylera is cost prohibitive for this patient. Can we call in it's component parts? Please advise

## 2018-05-24 ENCOUNTER — Ambulatory Visit: Payer: 59 | Admitting: Gastroenterology

## 2018-05-24 NOTE — Telephone Encounter (Signed)
Patient has been notified that component parts to Pylera have been sent to his pharmacy. He will call if there are any questions or concerns.

## 2018-06-13 ENCOUNTER — Other Ambulatory Visit (INDEPENDENT_AMBULATORY_CARE_PROVIDER_SITE_OTHER): Payer: 59

## 2018-06-13 DIAGNOSIS — A048 Other specified bacterial intestinal infections: Secondary | ICD-10-CM | POA: Diagnosis not present

## 2018-06-13 DIAGNOSIS — B181 Chronic viral hepatitis B without delta-agent: Secondary | ICD-10-CM

## 2018-06-13 LAB — HEPATIC FUNCTION PANEL
ALBUMIN: 4.7 g/dL (ref 3.5–5.2)
ALT: 34 U/L (ref 0–53)
AST: 33 U/L (ref 0–37)
Alkaline Phosphatase: 62 U/L (ref 39–117)
Bilirubin, Direct: 0.2 mg/dL (ref 0.0–0.3)
TOTAL PROTEIN: 7.9 g/dL (ref 6.0–8.3)
Total Bilirubin: 1.3 mg/dL — ABNORMAL HIGH (ref 0.2–1.2)

## 2018-06-14 ENCOUNTER — Other Ambulatory Visit: Payer: 59

## 2018-06-14 ENCOUNTER — Ambulatory Visit: Payer: 59 | Admitting: Gastroenterology

## 2018-06-14 DIAGNOSIS — D72819 Decreased white blood cell count, unspecified: Secondary | ICD-10-CM

## 2018-06-15 LAB — HELICOBACTER PYLORI  SPECIAL ANTIGEN
MICRO NUMBER: 91392683
RESULT: DETECTED — AB
SPECIMEN QUALITY: ADEQUATE

## 2018-06-20 ENCOUNTER — Other Ambulatory Visit: Payer: Self-pay

## 2018-06-20 DIAGNOSIS — A048 Other specified bacterial intestinal infections: Secondary | ICD-10-CM

## 2018-06-20 DIAGNOSIS — B181 Chronic viral hepatitis B without delta-agent: Secondary | ICD-10-CM

## 2018-06-20 LAB — NASH FIBROSURE
ALPHA 2-MACROGLOBULINS, QN: 136 mg/dL (ref 110–276)
ALT (SGPT) P5P: 48 IU/L (ref 0–55)
APOLIPOPROTEIN A I: 119 mg/dL (ref 101–178)
AST (SGOT) P5P: 44 IU/L — AB (ref 0–40)
BILIRUBIN, TOTAL: 1.1 mg/dL (ref 0.0–1.2)
CHOLESTEROL, TOTAL: 156 mg/dL (ref 100–199)
FIBROSIS SCORE: 0.51 — AB (ref 0.00–0.21)
GGT: 26 IU/L (ref 0–65)
GLUCOSE: 84 mg/dL (ref 65–99)
Height: 69 in
NASH Score: 0.5 — ABNORMAL HIGH
Steatosis Score: 0.52 — ABNORMAL HIGH (ref 0.00–0.30)
Triglycerides: 197 mg/dL — ABNORMAL HIGH (ref 0–149)
WEIGHT: 216 [lb_av]

## 2018-06-20 MED ORDER — OMEPRAZOLE 40 MG PO CPDR: 40.0000 mg | DELAYED_RELEASE_CAPSULE | Freq: Two times a day (BID) | ORAL | 0 refills | Status: DC

## 2018-06-20 MED ORDER — AMOXICILLIN 500 MG PO TABS: 1000.0000 mg | ORAL_TABLET | Freq: Two times a day (BID) | ORAL | 0 refills | Status: AC

## 2018-06-20 MED ORDER — LEVOFLOXACIN 500 MG PO TABS: 500.0000 mg | ORAL_TABLET | Freq: Every day | ORAL | 0 refills | Status: AC

## 2018-07-06 ENCOUNTER — Telehealth: Payer: Self-pay | Admitting: Gastroenterology

## 2018-07-06 NOTE — Telephone Encounter (Signed)
The pt has an appt on 12/31 with Dr Meridee ScoreMansouraty to discuss liver biopsy versus other treatment. The pt has been advised of the information and verbalized understanding.

## 2018-07-22 NOTE — Progress Notes (Signed)
[place]   Telephone:([phone]) [phone] [phone]   Clinic Follow up Note   Patient Care Team: Benito Mccreedy, MD as PCP - General (Internal Medicine) Truitt Merle, MD as Consulting Physician (Hematology) Carlyle Basques, MD as Consulting Physician (Infectious Diseases)  Date of Service:  07/25/2018  CHIEF COMPLAINT: Leukopenia  CURRENT THERAPY:  Observation  INTERVAL HISTORY:  Isaac Brown is here for a follow up of his Leukopenia. He presents to the clinic today by himself. He notes he is doing well. He denies any change since last visit. He notes having occasional muscular pain related to work at [employer].  He followed by with Dr. Baxter Flattery who referred him to GI. He went to GI who will do liver biopsy to see if treatment is necessary. He denies any other recent infection.     REVIEW OF SYSTEMS:   Constitutional: Denies fevers, chills or abnormal weight loss Eyes: Denies blurriness of vision Ears, nose, mouth, throat, and face: Denies mucositis or sore throat Respiratory: Denies cough, dyspnea or wheezes Cardiovascular: Denies palpitation, chest discomfort or lower extremity swelling Gastrointestinal:  Denies nausea, heartburn or change in bowel habits Skin: Denies abnormal skin rashes MSK: (+) general Muscular pain  Lymphatics: Denies new lymphadenopathy or easy bruising Neurological:Denies numbness, tingling or new weaknesses Behavioral/Psych: Mood is stable, no new changes  All other systems were reviewed with the patient and are negative.  MEDICAL HISTORY:  Past Medical History:  Diagnosis Date  . Hepatitis B   . Hypertension   . Liver fibrosis     SURGICAL HISTORY: Past Surgical History:  Procedure Laterality Date  . NO PAST SURGERIES      I have reviewed the social history and family history with the patient and they are unchanged from previous note.  ALLERGIES:  has No Known Allergies.  MEDICATIONS:  Current Outpatient  Medications  Medication Sig Dispense Refill  . amLODipine-benazepril (LOTREL) 10-40 MG capsule Take 1 capsule by mouth daily.    Marland Kitchen aspirin EC 81 MG tablet Take 81 mg by mouth daily.    Marland Kitchen atorvastatin (LIPITOR) 20 MG tablet Take 1 tablet by mouth daily.    . Cholecalciferol (VITAMIN D PO) Take 2,000 Units by mouth daily.    Marland Kitchen bismuth-metronidazole-tetracycline (PYLERA) 140-125-125 MG capsule Take 3 capsules by mouth 4 (four) times daily -  before meals and at bedtime for 14 days. 168 capsule 0  . omeprazole (PRILOSEC) 40 MG capsule Take 1 capsule (40 mg total) by mouth 2 (two) times daily. 60 capsule 0   No current facility-administered medications for this visit.     PHYSICAL EXAMINATION: ECOG PERFORMANCE STATUS: 0 - Asymptomatic  Vitals:   07/25/18 0905  BP: (!) 142/92  Pulse: 77  Resp: 20  Temp: 98.8 F (37.1 C)  SpO2: 100%   Filed Weights   07/25/18 0905  Weight: 221 lb 1.6 oz (100.3 kg)    GENERAL:alert, no distress and comfortable SKIN: skin color, texture, turgor are normal, no rashes or significant lesions EYES: normal, Conjunctiva are pink and non-injected, sclera clear OROPHARYNX:no exudate, no erythema and lips, buccal mucosa, and tongue normal  NECK: supple, thyroid normal size, non-tender, without nodularity LYMPH:  no palpable lymphadenopathy in the cervical, axillary or inguinal LUNGS: clear to auscultation and percussion with normal breathing effort HEART: regular rate & rhythm and no murmurs and no lower extremity edema ABDOMEN:abdomen soft, non-tender and normal bowel sounds Musculoskeletal:no cyanosis of digits and no clubbing  NEURO: alert & oriented x  3 with fluent speech, no focal motor/sensory deficits  LABORATORY DATA:  I have reviewed the data as listed CBC Latest Ref Rng & Units 07/25/2018 04/13/2018 01/25/2018  WBC 4.0 - 10.5 K/uL 3.1(L) 3.8(L) 2.8(L)  Hemoglobin 13.0 - 17.0 g/dL 15.1 15.0 14.9  Hematocrit 39.0 - 52.0 % 44.7 43.6 42.4  Platelets  150 - 400 K/uL 172 181.0 162     CMP Latest Ref Rng & Units 06/13/2018 04/13/2018 02/07/2018  Glucose 65 - 99 mg/dL - - -  BUN 6 - 20 mg/dL - - -  Creatinine 0.76 - 1.27 mg/dL - - -  Sodium 134 - 144 mmol/L - - -  Potassium 3.5 - 5.2 mmol/L - - -  Chloride 96 - 106 mmol/L - - -  CO2 20 - 29 mmol/L - - -  Calcium 8.7 - 10.2 mg/dL - - -  Total Protein 6.0 - 8.3 g/dL 7.9 7.9 7.3  Total Bilirubin 0.2 - 1.2 mg/dL 1.3(H) 1.8(H) 1.1  Alkaline Phos 39 - 117 U/L 62 60 -  AST 0 - 37 U/L 33 35 21  ALT 0 - 53 U/L 34 29 21      RADIOGRAPHIC STUDIES: I have personally reviewed the radiological images as listed and agreed with the findings in the report. No results found.   ASSESSMENT & PLAN:  Isaac Brown is a 37 y.o. male with   1. Leukopenia, with dominant mild neutropenia -He has had mild leukopenia, with predominant borderline neutropenia since at least 2014 when he was seen by Dr. Baxter Flattery for hepatitis B infection.  No history of recurrent infection.  Asymptomatic. His initial 01/2018 US abdomen showed no hepatosplenomegaly.  -I think he is neutropenia is likely related to his chronic hepatitis B, or normal variant due to his ethnic group. There is no inclination of a bone marrow biopsy at this time.  -Labs reviewed, WBC at 3.1, ANC at 1.2, overall stable and mild. His recent labs shows T bili at 1.3, otherwise normal liver function and CBC.  -I recommend flu shot given his risk for infection. He agreed, will proceed today.  -Given the mild neutropenia, he does not need any treatment for now.  I recommend continue medical monitoring.  We discussed infection precautions. -I encouraged him to maintain healthy balanced diet and exercise regular to maintain his weight, and sleep adequately and positive attitude. I encouraged him to avoid alcohol and smoking.  -F/u in 1 year   2.  Chronic hepatitis B, untreated -He was last seen by Dr. Graylon Good in 2013, treatment was not offered at that  time. Dr. Baxter Flattery who referred him to GI Dr. Rush Landmark who recommends a Liver biopsy to determine if treatment is necessary.   3. HTN -Follow-up with primary care physician and continue medication.   Plan: -Flu shot today  -Lab and f/u in 1 year     No problem-specific Assessment & Plan notes found for this encounter.   No orders of the defined types were placed in this encounter.  All questions were answered. The patient knows to call the clinic with any problems, questions or concerns. No barriers to learning was detected. I spent 10 minutes counseling the patient face to face. The total time spent in the appointment was 15 minutes and more than 50% was on counseling and review of test results     Truitt Merle, MD 07/25/2018   I, Joslyn Devon, am acting as scribe for Truitt Merle, MD.   I have reviewed  the above documentation for accuracy and completeness, and I agree with the above.

## 2018-07-25 ENCOUNTER — Inpatient Hospital Stay: Payer: 59

## 2018-07-25 ENCOUNTER — Inpatient Hospital Stay: Payer: 59 | Attending: Hematology | Admitting: Hematology

## 2018-07-25 ENCOUNTER — Other Ambulatory Visit: Payer: Self-pay

## 2018-07-25 ENCOUNTER — Other Ambulatory Visit: Payer: 59

## 2018-07-25 ENCOUNTER — Telehealth: Payer: Self-pay | Admitting: Hematology

## 2018-07-25 VITALS — BP 142/92 | HR 77 | Temp 98.8°F | Resp 20 | Ht 69.0 in | Wt 221.1 lb

## 2018-07-25 DIAGNOSIS — Z23 Encounter for immunization: Secondary | ICD-10-CM | POA: Diagnosis not present

## 2018-07-25 DIAGNOSIS — Z7982 Long term (current) use of aspirin: Secondary | ICD-10-CM | POA: Diagnosis not present

## 2018-07-25 DIAGNOSIS — B181 Chronic viral hepatitis B without delta-agent: Secondary | ICD-10-CM | POA: Diagnosis not present

## 2018-07-25 DIAGNOSIS — D72819 Decreased white blood cell count, unspecified: Secondary | ICD-10-CM | POA: Insufficient documentation

## 2018-07-25 DIAGNOSIS — Z79899 Other long term (current) drug therapy: Secondary | ICD-10-CM | POA: Diagnosis not present

## 2018-07-25 DIAGNOSIS — I10 Essential (primary) hypertension: Secondary | ICD-10-CM | POA: Insufficient documentation

## 2018-07-25 LAB — CBC WITH DIFFERENTIAL (CANCER CENTER ONLY)
Abs Immature Granulocytes: 0.01 10*3/uL (ref 0.00–0.07)
BASOS ABS: 0 10*3/uL (ref 0.0–0.1)
BASOS PCT: 0 %
EOS ABS: 0.1 10*3/uL (ref 0.0–0.5)
EOS PCT: 2 %
HCT: 44.7 % (ref 39.0–52.0)
Hemoglobin: 15.1 g/dL (ref 13.0–17.0)
Immature Granulocytes: 0 %
Lymphocytes Relative: 48 %
Lymphs Abs: 1.5 10*3/uL (ref 0.7–4.0)
MCH: 28 pg (ref 26.0–34.0)
MCHC: 33.8 g/dL (ref 30.0–36.0)
MCV: 82.8 fL (ref 80.0–100.0)
MONO ABS: 0.3 10*3/uL (ref 0.1–1.0)
Monocytes Relative: 11 %
NRBC: 0 % (ref 0.0–0.2)
Neutro Abs: 1.2 10*3/uL — ABNORMAL LOW (ref 1.7–7.7)
Neutrophils Relative %: 39 %
Platelet Count: 172 10*3/uL (ref 150–400)
RBC: 5.4 MIL/uL (ref 4.22–5.81)
RDW: 12.7 % (ref 11.5–15.5)
WBC: 3.1 10*3/uL — AB (ref 4.0–10.5)

## 2018-07-25 MED ORDER — INFLUENZA VAC SPLIT QUAD 0.5 ML IM SUSY
0.5000 mL | PREFILLED_SYRINGE | Freq: Once | INTRAMUSCULAR | Status: AC
  Administered 2018-07-25: 0.5 mL via INTRAMUSCULAR

## 2018-07-25 MED ORDER — INFLUENZA VAC SPLIT QUAD 0.5 ML IM SUSY
PREFILLED_SYRINGE | INTRAMUSCULAR | Status: AC
  Filled 2018-07-25: qty 0.5

## 2018-07-25 NOTE — Telephone Encounter (Signed)
Gave patient avs report and appointments for December 2020.  °

## 2018-07-26 ENCOUNTER — Ambulatory Visit (INDEPENDENT_AMBULATORY_CARE_PROVIDER_SITE_OTHER): Payer: 59 | Admitting: Gastroenterology

## 2018-07-26 ENCOUNTER — Encounter: Payer: Self-pay | Admitting: Hematology

## 2018-07-26 ENCOUNTER — Encounter

## 2018-07-26 ENCOUNTER — Encounter: Payer: Self-pay | Admitting: Gastroenterology

## 2018-07-26 ENCOUNTER — Other Ambulatory Visit (INDEPENDENT_AMBULATORY_CARE_PROVIDER_SITE_OTHER): Payer: 59

## 2018-07-26 VITALS — BP 138/80 | HR 84 | Ht 69.0 in | Wt 221.4 lb

## 2018-07-26 DIAGNOSIS — B181 Chronic viral hepatitis B without delta-agent: Secondary | ICD-10-CM

## 2018-07-26 DIAGNOSIS — A048 Other specified bacterial intestinal infections: Secondary | ICD-10-CM

## 2018-07-26 LAB — BASIC METABOLIC PANEL
BUN: 15 mg/dL (ref 6–23)
CO2: 28 mEq/L (ref 19–32)
Calcium: 8.8 mg/dL (ref 8.4–10.5)
Chloride: 102 mEq/L (ref 96–112)
Creatinine, Ser: 1.04 mg/dL (ref 0.40–1.50)
GFR: 102.8 mL/min (ref >60.00)
Glucose, Bld: 97 mg/dL (ref 70–99)
Potassium: 3.6 mEq/L (ref 3.5–5.1)
Sodium: 138 mEq/L (ref 135–145)

## 2018-07-26 LAB — HEPATIC FUNCTION PANEL
ALT: 32 U/L (ref 0–53)
AST: 29 U/L (ref 0–37)
Albumin: 4.6 g/dL (ref 3.5–5.2)
Alkaline Phosphatase: 61 U/L (ref 39–117)
Bilirubin, Direct: 0.2 mg/dL (ref 0.0–0.3)
Total Bilirubin: 1.6 mg/dL — ABNORMAL HIGH (ref 0.2–1.2)
Total Protein: 7.6 g/dL (ref 6.0–8.3)

## 2018-07-26 LAB — CBC
HCT: 46.8 % (ref 39.0–52.0)
Hemoglobin: 15.8 g/dL (ref 13.0–17.0)
MCHC: 33.6 g/dL (ref 30.0–36.0)
MCV: 84.4 fl (ref 78.0–100.0)
Platelets: 153 10*3/uL (ref 150.0–400.0)
RBC: 5.55 Mil/uL (ref 4.22–5.81)
RDW: 13.2 % (ref 11.5–15.5)
WBC: 3.1 10*3/uL — AB (ref 4.0–10.5)

## 2018-07-26 LAB — PROTIME-INR
INR: 1.1 ratio — ABNORMAL HIGH (ref 0.8–1.0)
Prothrombin Time: 12.8 s (ref 9.6–13.1)

## 2018-07-26 NOTE — Patient Instructions (Signed)
You have been scheduled for an abdominal ultrasound at [facility] (1st floor of hospital) on 08/26/18 at 10am. Please arrive 15 minutes prior to your appointment for registration. Make certain not to have anything to eat or drink 6 hours prior to your appointment. Should you need to reschedule your appointment, please contact radiology at [phone]. This test typically takes about 30 minutes to perform.  Your provider has requested that you go to the basement level for lab work before leaving today. Press "B" on the elevator. The lab is located at the first door on the left as you exit the elevator.  Thank you for entrusting me with your care and choosing [facility] care.  Dr Meridee ScoreMansouraty

## 2018-07-26 NOTE — Progress Notes (Signed)
GASTROENTEROLOGY OUTPATIENT CLINIC VISIT   Primary Care Provider Jackie PlumOsei-Bonsu, George, MD [id number] [place] [id number] [phone]  Patient Profile: Isaac StabsSamuel Brown is a 37 y.o. male with a pmh significant for Chronic HBV Infection (HBeAb +), HTN, Leukopenia, H. Pylori Infection (s/p treatment - awaiting eradication results).  The patient presents to the [facility] for an evaluation and management of problem(s) noted below:  Problem List 1. Chronic viral hepatitis B without delta agent and without coma (HCC)   2. Helicobacter pylori infection     History of Present Illness: Please see patient's initial consultation visit for full details of HPI.    Interval History: The patient returns today in scheduled follow-up.  When we initially tested the patient for H. pylori in the setting of some abdominal discomfort in the midepigastrium and dyspeptic symptoms he came back positive.  We treated him and evaluated for eradication in November.  In November his H. pylori stool antigen returned positive once again.  We proceeded with him being treated with salvage therapy with concomitant use of levofloxacin, amoxicillin, and PPI.  He subsequently has done well and no longer has midepigastric abdominal discomfort or any symptoms postprandially.  He gave an H. pylori stool study today for evaluation for eradication.  He had also undergone evaluation from a laboratory perspective to evaluate for the possibility of more advanced scarring and used a fibro-sure test that did not suggest evidence of significant steatosis or fibrosis, be at this was a test not done normally for hepatitis B.  When we last checked his hepatitis B DNA it had decreased to under 1100 overall.  The patient has considered the role of liver biopsy as we had discussed previously and will leave it up to us as to what we think is most beneficial.  The patient denies any issues with jaundice, scleral  icterus, pruritus, darkened/amber urine, clay-colored stools, LE edema, hematemesis, coffee-ground emesis, abdominal distention, confusion, new generalized pruritus.    GI Review of Systems Positive as above Negative for pyrosis, odynophagia, dysphagia, melena, hematochezia   Review of Systems General: Denies fevers/chills/weight loss Cardiovascular: Denies chest pain Pulmonary: Denies shortness of breath Gastroenterological: See HPI Genitourinary: See HPI Hematological: Denies easy bruising Dermatological: Denies new skin changes Psychological: Mood is stable   Medications Current Outpatient Medications  Medication Sig Dispense Refill  . amLODipine-benazepril (LOTREL) 10-40 MG capsule Take 1 capsule by mouth daily.    Marland Kitchen. aspirin EC 81 MG tablet Take 81 mg by mouth daily.    Marland Kitchen. atorvastatin (LIPITOR) 20 MG tablet Take 1 tablet by mouth daily.    . Cholecalciferol (VITAMIN D PO) Take 2,000 Units by mouth daily.     No current facility-administered medications for this visit.     Allergies No Known Allergies  Histories Past Medical History:  Diagnosis Date  . Hepatitis B   . Hypertension   . Liver fibrosis    Past Surgical History:  Procedure Laterality Date  . NO PAST SURGERIES     Social History   Socioeconomic History  . Marital status: Married    Spouse name: Not on file  . Number of children: 3  . Years of education: Not on file  . Highest education level: Not on file  Occupational History  . Occupation: Company secretarywarehouse worker  Social Needs  . Financial resource strain: Not on file  . Food insecurity:    Worry: Not on file    Inability: Not on file  .  Transportation needs:    Medical: Not on file    Non-medical: Not on file  Tobacco Use  . Smoking status: Never Smoker  . Smokeless tobacco: Never Used  Substance and Sexual Activity  . Alcohol use: No  . Drug use: No  . Sexual activity: Yes    Partners: Female  Lifestyle  . Physical activity:    Days per  week: Not on file    Minutes per session: Not on file  . Stress: Not on file  Relationships  . Social connections:    Talks on phone: Not on file    Gets together: Not on file    Attends religious service: Not on file    Active member of club or organization: Not on file    Attends meetings of clubs or organizations: Not on file    Relationship status: Not on file  . Intimate partner violence:    Fear of current or ex partner: Not on file    Emotionally abused: Not on file    Physically abused: Not on file    Forced sexual activity: Not on file  Other Topics Concern  . Not on file  Social History Narrative  . Not on file   Family History  Problem Relation Age of Onset  . Diabetes Mother   . Hepatitis B Mother   . Liver disease Sister   . Hepatitis B Sister   . Colon cancer Neg Hx   . Esophageal cancer Neg Hx   . Inflammatory bowel disease Neg Hx   . Pancreatic cancer Neg Hx   . Rectal cancer Neg Hx   . Stomach cancer Neg Hx    I have reviewed his medical, social, and family history in detail and updated the electronic medical record as necessary.    PHYSICAL EXAMINATION  BP 138/80 (BP Location: Left Arm, Patient Position: Sitting, Cuff Size: Normal)   Pulse 84   Ht 5\' 9"  (1.753 m)   Wt 221 lb 6 oz (100.4 kg)   BMI 32.69 kg/m  Wt Readings from Last 3 Encounters:  07/26/18 221 lb 6 oz (100.4 kg)  07/25/18 221 lb 1.6 oz (100.3 kg)  04/13/18 216 lb 6 oz (98.1 kg)  GEN: NAD, appears stated age, doesn't appear chronically ill PSYCH: Cooperative, without pressured speech EYE: Conjunctivae are pale but no overt icterus ENT: MMM CV: RR without R/Gs  RESP: CTAB posteriorly, without adventitious sounds present GI: NABS, soft, NT/ND, without rebound or guarding, no HSM appreciated MSK/EXT: No lower extremity edema SKIN: No jaundice or skin rashes NEURO:  Alert & Oriented x 3, no focal deficits, no evidence of asterixis   REVIEW OF DATA  I reviewed the following data  at the time of this encounter:  GI Procedures and Studies  No new procedures to review  Laboratory Studies  Reviewed in epic  Imaging Studies  No new studies to review   ASSESSMENT  Mr. Isaac Brown is a 37 y.o. male with a pmh significant for Chronic HBV Infection (HBeAb +), HTN, Leukopenia, H. Pylori Infection (s/p treatment - awaiting eradication results).  The patient is seen today for evaluation and management of:  1. Chronic viral hepatitis B without delta agent and without coma (HCC)   2. Helicobacter pylori infection    This is a clinically and hemodynamically stable patient who has had chronic longstanding inactive hepatitis B.  He is negative for hepatitis D and is a antibody positive.  His liver biochemical tests have been  normal other than a slight indirect bilirubin elevation.  Evaluation for noninvasive fibrosis scoring would be suggestive of less likely to have significant scarring other than an elastography that suggested some F3/F4 fibrosis.  We have previously discussed the role of a liver biopsy to better delineate the need for possible initiation of medication therapy for the patient.  He has been on the fence on this in the past but is up for it if it is felt necessary at this point in time.  I would like to see what his hepatitis B DNA has and then proceed from there.  The role for a liver biopsy would really be to entertain whether the degree of scarring is at a point where we truly need to move forward with initiation of therapy. The risks of percutaneous liver biopsy including fever, infection, bile leak, bile peritonitis, capsular hematoma were re-discussed with the patient.  Based on AASLD there is data that for patients who are hepatitis B E antigen negative that treatment be initiated once a diagnosis is established and patient has ALT greater than 2 times the upper limit of normal and HPV DNA greater than 2000.  Those patients with an ALT less than 2 times the upper limit of  normal serial follow-up is needed to differentiate an inactive carrier state from hep B E antigen negative chronic hepatitis.  Liver biopsies should be considered when HBV DNA the greater than 2000 and you only have normal or mildly elevated ALT.  I would also consider planning surveillance liver ultrasound due to the chronicity of his density infection and his race/ethnicity and we would plan for [facility] screening every 6 months with ultrasounds as well as an AFP every now and then considering every 2 years a cross-sectional CT scan.  All patient questions were answered, to the best of my ability, and the patient agrees to the aforementioned plan of action with follow-up as indicated.   PLAN  Labs as outlined below Hold on liver biopsy for now but will consider based on HBV DNA and liver biochemical testing Follow-up H. pylori stool antigen to ensure eradication Liver ultrasound with AFP to be ordered at 69-month interval in January or February Follow-up to be dictated based on the above   Orders Placed This Encounter  Procedures  . US Abdomen Limited RUQ  . Hepatitis B DNA, ultraquantitative, PCR  . CBC  . Basic metabolic panel  . Hepatic function panel  . Protime-INR  . AFP tumor marker    New Prescriptions   No medications on file   Modified Medications   No medications on file    Planned Follow Up: No follow-ups on file.   Corliss Parish, MD [facility] Office # [phone]

## 2018-07-28 ENCOUNTER — Other Ambulatory Visit: Payer: 59

## 2018-07-28 ENCOUNTER — Telehealth: Payer: Self-pay

## 2018-07-28 DIAGNOSIS — B181 Chronic viral hepatitis B without delta-agent: Secondary | ICD-10-CM

## 2018-07-28 NOTE — Telephone Encounter (Signed)
Spoke to patient. Informed him that Dr Meridee Score added more blood work,and the lab was not able to add it on to his previous order. Patient will come in on 07/29/18 to have Direct Bilirubin drawn.

## 2018-07-29 ENCOUNTER — Encounter: Payer: Self-pay | Admitting: Gastroenterology

## 2018-07-29 ENCOUNTER — Other Ambulatory Visit (INDEPENDENT_AMBULATORY_CARE_PROVIDER_SITE_OTHER): Payer: 59

## 2018-07-29 DIAGNOSIS — B181 Chronic viral hepatitis B without delta-agent: Secondary | ICD-10-CM

## 2018-07-29 LAB — HELICOBACTER PYLORI  SPECIAL ANTIGEN
MICRO NUMBER:: 91556931
RESULT:: DETECTED — AB
SPECIMEN QUALITY: ADEQUATE

## 2018-07-29 LAB — BILIRUBIN, DIRECT: Bilirubin, Direct: 0.2 mg/dL (ref 0.0–0.3)

## 2018-07-30 LAB — HEPATITIS B DNA, ULTRAQUANTITATIVE, PCR
Hepatitis B DNA (Calc): 3.57 Log IU/mL — ABNORMAL HIGH
Hepatitis B DNA: 3700 IU/mL — ABNORMAL HIGH

## 2018-08-01 ENCOUNTER — Other Ambulatory Visit: Payer: Self-pay

## 2018-08-01 DIAGNOSIS — A048 Other specified bacterial intestinal infections: Secondary | ICD-10-CM

## 2018-08-01 LAB — AFP TUMOR MARKER: AFP-Tumor Marker: 6.5 ng/mL — ABNORMAL HIGH (ref <6.1)

## 2018-08-01 MED ORDER — OMEPRAZOLE 40 MG PO CPDR: 40.0000 mg | DELAYED_RELEASE_CAPSULE | Freq: Two times a day (BID) | ORAL | 0 refills | Status: DC

## 2018-08-01 MED ORDER — METRONIDAZOLE 500 MG PO TABS: 500.0000 mg | ORAL_TABLET | Freq: Three times a day (TID) | ORAL | 0 refills | Status: AC

## 2018-08-01 MED ORDER — CLARITHROMYCIN 500 MG PO TABS: 500.0000 mg | ORAL_TABLET | Freq: Two times a day (BID) | ORAL | 0 refills | Status: AC

## 2018-08-01 MED ORDER — AMOXICILLIN 500 MG PO TABS: 1000.0000 mg | ORAL_TABLET | Freq: Two times a day (BID) | ORAL | 0 refills | Status: AC

## 2018-08-03 ENCOUNTER — Telehealth: Payer: Self-pay | Admitting: Gastroenterology

## 2018-08-03 NOTE — Telephone Encounter (Signed)
The patient's AFP returned elevated at 6.5.  This had not previously been noted.  We had planned for a six-month follow-up ultrasound to be performed which has currently been ordered and is pending completion.  With elevation in AFP I have a bit of concern as to why this is elevated.  We will begin with a liver ultrasound and consider a CT cirrhosis protocol/triple phase if necessary if the ultrasound is unrevealing. I tried calling the patient to let him know about these results and I have left a message for him to call back the office. I will try and reach him again or if the patient calls back having a number that he wants [place] to try and reach would be reasonable and I will try and do in the next couple of days. This is for documentation purposes as well as the result note.  Corliss Parish, MD [facility] # [phone]

## 2018-08-03 NOTE — Telephone Encounter (Signed)
Patient returning call stating the number on file is the best option.

## 2018-08-03 NOTE — Telephone Encounter (Signed)
Dr Meridee Score the pt states the number to call listed in the system is a good number to return his call.

## 2018-08-03 NOTE — Telephone Encounter (Signed)
1/8 waiting for pt to return Dr Judie Petit call

## 2018-08-04 ENCOUNTER — Telehealth: Payer: Self-pay | Admitting: Gastroenterology

## 2018-08-04 NOTE — Telephone Encounter (Signed)
The pt was provided the phone number to call and reschedule his US at his convenience.

## 2018-08-04 NOTE — Telephone Encounter (Signed)
I called and spoke with the patient this morning. I let him know my findings on the laboratories of his elevated AFP. As noted before we will proceed with a liver ultrasound which I would like to do a bit sooner than what is already scheduled which we will work with him to try and arrange. If the liver ultrasound is unrevealing I think a liver protocoled triple phase CT abdomen would then be indicated based on the elevation in AFP. This is to ensure we are not missing an HCC. I will relay this message to advanced RN Doroteo Glassman to work with the patient in the coming days to schedule a sooner liver ultrasound. He will plan to maintain the clinic appointment we have set up for him in February.  Corliss Parish, MD [facility] # [phone]

## 2018-08-04 NOTE — Telephone Encounter (Signed)
Pt would like to r/s US either for same day but later in the day or a different day. Pls call him.

## 2018-08-04 NOTE — Telephone Encounter (Signed)
[place] has been rescheduled to 08/09/18 at 930 am the pt has been advised and The pt has been advised of the information and verbalized understanding.

## 2018-08-09 ENCOUNTER — Other Ambulatory Visit (HOSPITAL_COMMUNITY): Payer: 59

## 2018-08-12 ENCOUNTER — Ambulatory Visit (HOSPITAL_COMMUNITY)
Admission: RE | Admit: 2018-08-12 | Discharge: 2018-08-12 | Disposition: A | Discharge status: Home / Self Care | Payer: 59 | Source: Ambulatory Visit | Attending: Gastroenterology | Admitting: Gastroenterology

## 2018-08-12 DIAGNOSIS — B181 Chronic viral hepatitis B without delta-agent: Secondary | ICD-10-CM | POA: Diagnosis present

## 2018-08-12 DIAGNOSIS — B1910 Unspecified viral hepatitis B without hepatic coma: Secondary | ICD-10-CM | POA: Diagnosis not present

## 2018-08-12 DIAGNOSIS — A048 Other specified bacterial intestinal infections: Secondary | ICD-10-CM | POA: Diagnosis not present

## 2018-08-22 IMAGING — US US ABDOMEN COMPLETE
1 series · 14 of 25 positions shown · non-contrast
Comparison: None.

CLINICAL DATA: Chronic hepatitis.  Evaluate liver and spleen.

EXAM:
ABDOMEN ULTRASOUND COMPLETE

[Series 1: us abdomen complete · 14 of 98 slices shown]
[im 1/98]
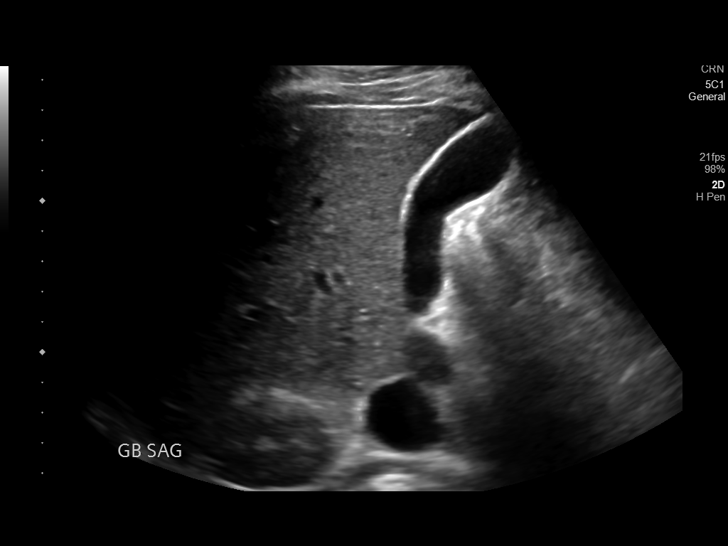
[im 9/98]
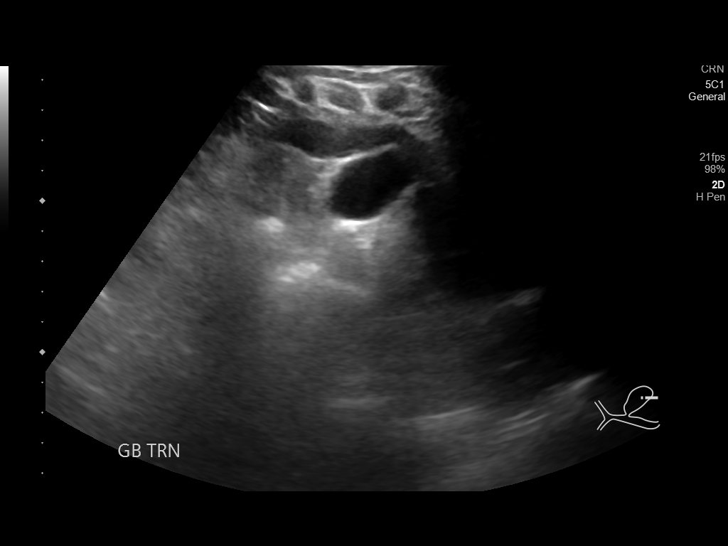
[im 17/98]
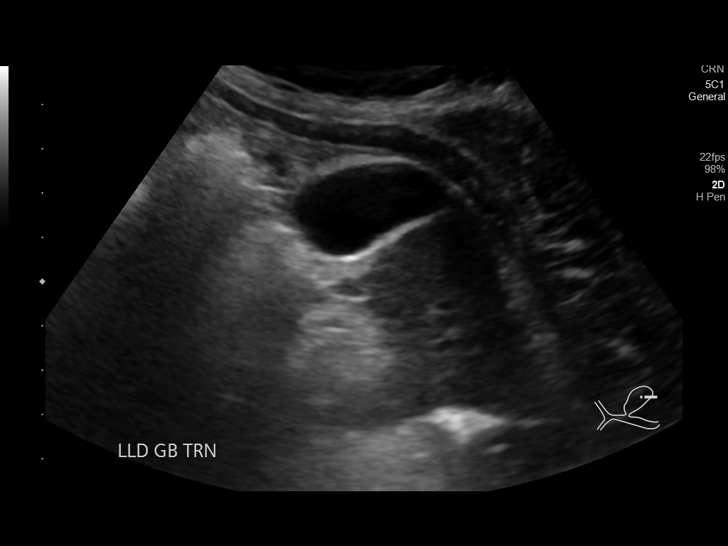
[im 25/98]
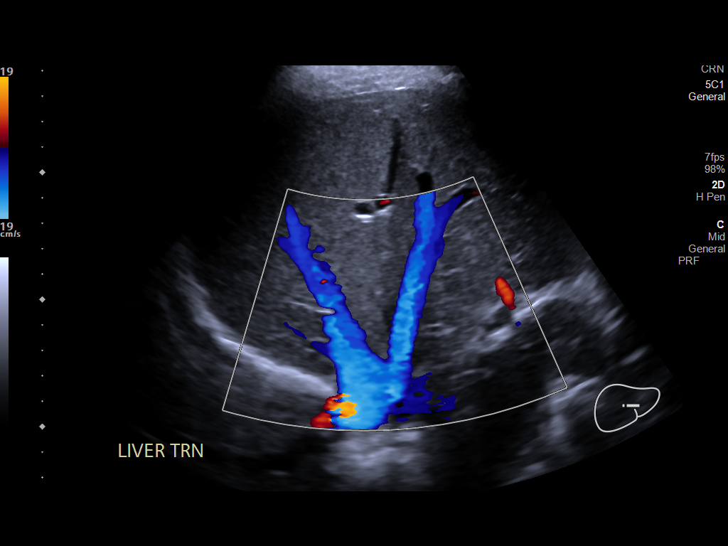
[im 33/98]
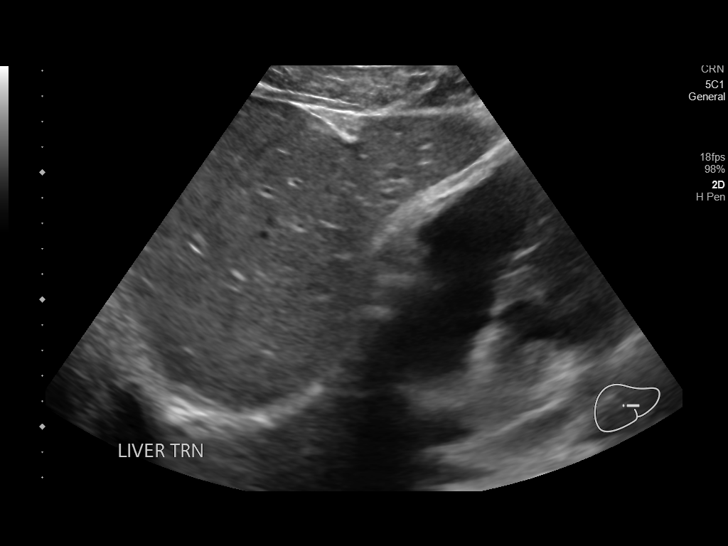
[im 37/98]
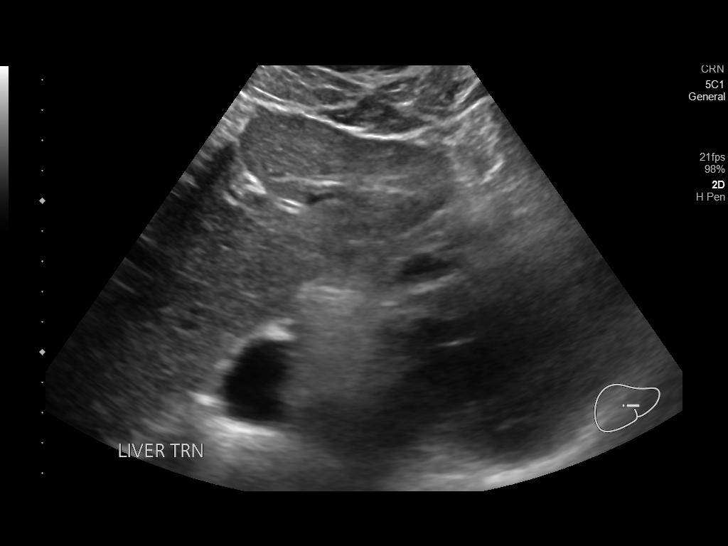
[im 45/98]
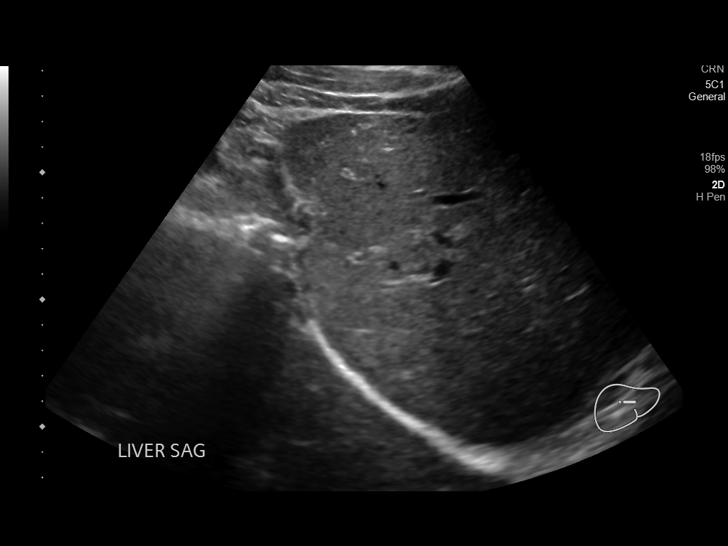
[im 53/98]
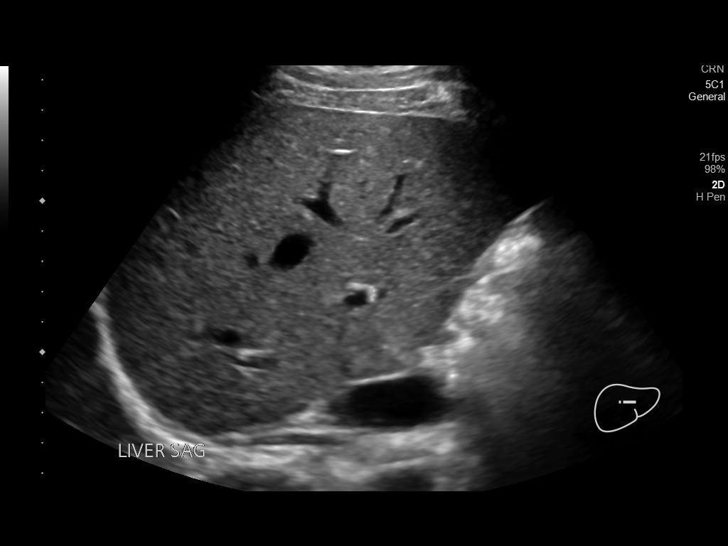
[im 61/98]
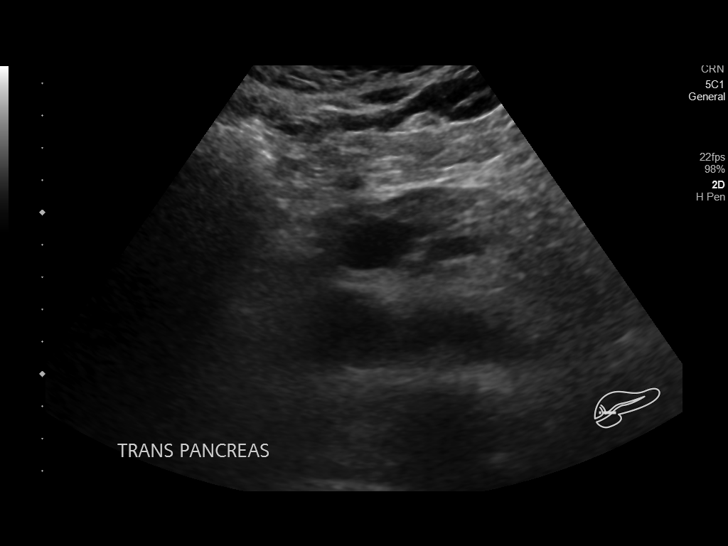
[im 65/98]
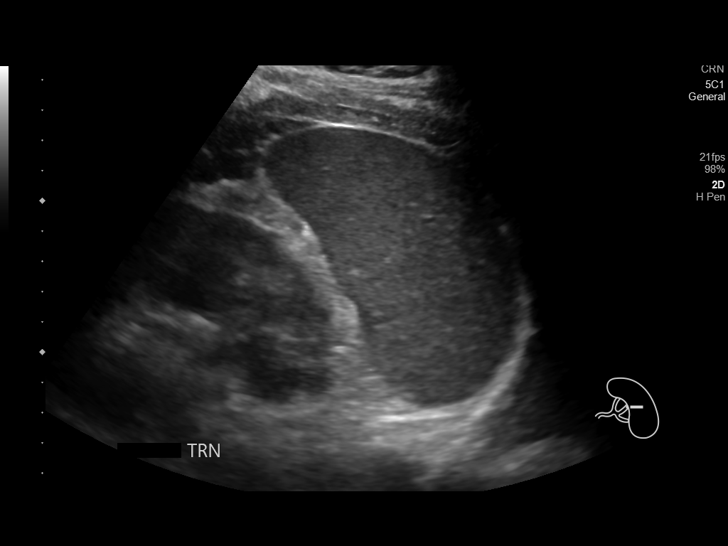
[im 73/98]
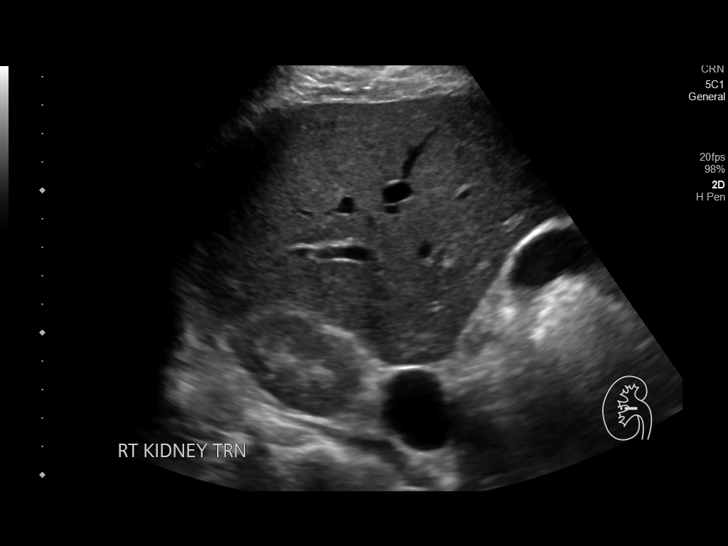
[im 81/98]
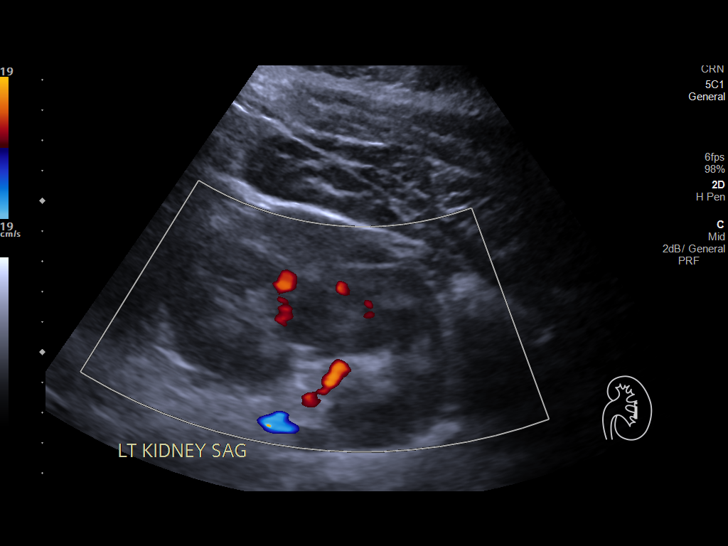
[im 89/98]
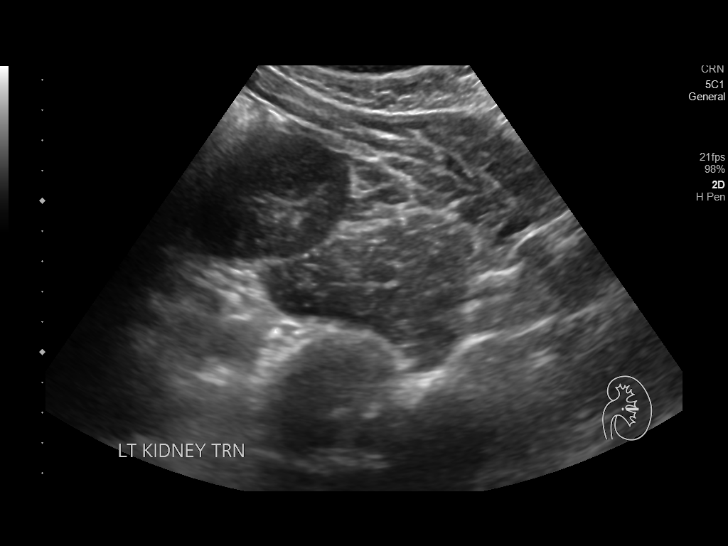
[im 98/98]
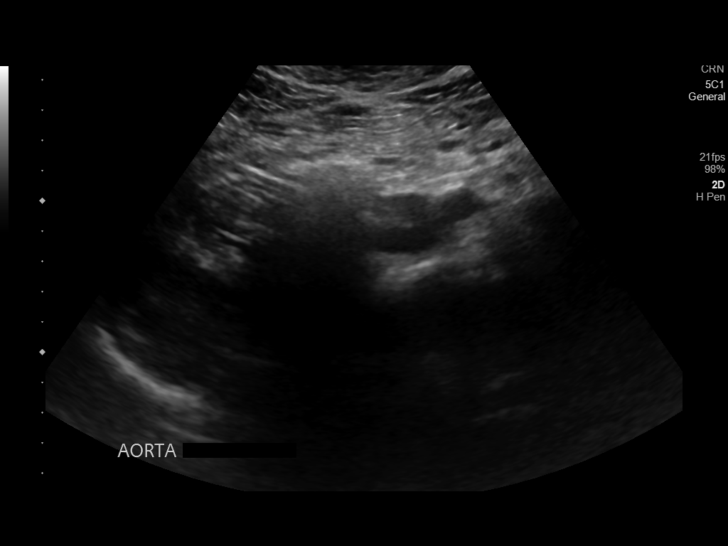

[14 of 25 positions shown; findings below may reference images not displayed]

FINDINGS: Gallbladder: No gallstones or wall thickening visualized. No
sonographic Murphy sign noted by sonographer.

Common bile duct: Diameter: 1.9 mm

Liver: No focal lesion identified. Within normal limits in
parenchymal echogenicity. Portal vein is patent on color Doppler
imaging with normal direction of blood flow towards the liver.

IVC: No abnormality visualized.

Pancreas: Visualized portion unremarkable.

Spleen: Size and appearance within normal limits.

Right Kidney: Length: 12.1 cm. Echogenicity within normal limits. No
mass or hydronephrosis visualized.

Left Kidney: Length: 11.0 cm. Echogenicity within normal limits. No
mass or hydronephrosis visualized.

Abdominal aorta: No aneurysm visualized.

Other findings: None.
IMPRESSION: 1. No abnormalities identified. The liver and spleen are normal in
appearance.

## 2018-08-26 ENCOUNTER — Ambulatory Visit (HOSPITAL_COMMUNITY): Payer: 59

## 2018-08-29 ENCOUNTER — Telehealth: Payer: Self-pay | Admitting: Gastroenterology

## 2018-08-29 NOTE — Telephone Encounter (Signed)
Cancelled his appt per provided.. please resch within next 2 weeks.Marland Kitchen okay to [employer]

## 2018-08-30 ENCOUNTER — Ambulatory Visit: Payer: 59 | Admitting: Gastroenterology

## 2018-09-08 ENCOUNTER — Ambulatory Visit (INDEPENDENT_AMBULATORY_CARE_PROVIDER_SITE_OTHER): Payer: 59 | Admitting: Gastroenterology

## 2018-09-08 ENCOUNTER — Other Ambulatory Visit: Payer: 59

## 2018-09-08 ENCOUNTER — Encounter: Payer: Self-pay | Admitting: Gastroenterology

## 2018-09-08 VITALS — BP 122/86 | HR 78 | Ht 69.0 in | Wt 224.5 lb

## 2018-09-08 DIAGNOSIS — B181 Chronic viral hepatitis B without delta-agent: Secondary | ICD-10-CM | POA: Diagnosis not present

## 2018-09-08 DIAGNOSIS — A048 Other specified bacterial intestinal infections: Secondary | ICD-10-CM | POA: Diagnosis not present

## 2018-09-08 NOTE — Patient Instructions (Signed)
Your provider has requested that you go to the basement level for lab work in March 2020 . Press "B" on the elevator. The lab is located at the first door on the left as you exit the elevator.   If you are age 38 or older, your body mass index should be between 23-30. Your Body mass index is 33.15 kg/m. If this is out of the aforementioned range listed, please consider follow up with your Primary Care Provider.  If you are age 38 or younger, your body mass index should be between 19-25. Your Body mass index is 33.15 kg/m. If this is out of the aformentioned range listed, please consider follow up with your Primary Care Provider.   Please also remember to do stool studies as well.   Thank you for choosing me and [facility].  Dr. Meridee ScoreMansouraty

## 2018-09-12 NOTE — Progress Notes (Signed)
GASTROENTEROLOGY OUTPATIENT CLINIC VISIT   Primary Care Provider Isaac Plum, MD [address] [place] [address] [phone]  Patient Profile: Isaac Brown is a 38 y.o. male with a pmh significant for Chronic HBV Infection (HBeAb +), HTN, Leukopenia, H. Pylori Infection (s/p treatment - awaiting eradication results).  The patient presents to the [facility] for an evaluation and management of problem(s) noted below:  Problem List 1. Chronic viral hepatitis B without delta agent and without coma (HCC)   2. Helicobacter pylori infection     History of Present Illness: Please see patient's initial consultation visit and follow-up visits for full details of HPI.    Interval History: The patient presents for scheduled follow-up.  The patient is doing well overall.  No significant complaints.  He just completed his recurrent H. pylori treatment.  He has not had any recurrence of symptoms.  Denies any dysphagia or odynophagia or abdominal pain.  The patient denies any issues with jaundice, scleral icterus, pruritus, darkened/amber urine, clay-colored stools, LE edema, hematemesis, coffee-ground emesis, abdominal distention, confusion, new generalized pruritus.    GI Review of Systems Positive as above Negative for pyrosis, change in bowel habits, melena, hematochezia   Review of Systems General: Denies fevers/chills/weight loss Cardiovascular: Denies chest pain Pulmonary: Denies shortness of breath Gastroenterological: See HPI Genitourinary: See HPI Hematological: Denies easy bruising Dermatological: Denies jaundice Psychological: Mood is stable   Medications Current Outpatient Medications  Medication Sig Dispense Refill  . amLODipine-benazepril (LOTREL) 10-40 MG capsule Take 1 capsule by mouth daily.    Marland Kitchen aspirin EC 81 MG tablet Take 81 mg by mouth daily.    Marland Kitchen atorvastatin (LIPITOR) 20 MG tablet Take 1 tablet by mouth daily.    Marland Kitchen  omeprazole (PRILOSEC) 40 MG capsule Take 1 capsule (40 mg total) by mouth 2 (two) times daily. 60 capsule 0   No current facility-administered medications for this visit.     Allergies No Known Allergies  Histories Past Medical History:  Diagnosis Date  . Helicobacter pylori infection   . Hepatitis B   . Hypertension   . Liver fibrosis    Past Surgical History:  Procedure Laterality Date  . NO PAST SURGERIES     Social History   Socioeconomic History  . Marital status: Married    Spouse name: Not on file  . Number of children: 3  . Years of education: Not on file  . Highest education level: Not on file  Occupational History  . Occupation: Company secretary  Social Needs  . Financial resource strain: Not on file  . Food insecurity:    Worry: Not on file    Inability: Not on file  . Transportation needs:    Medical: Not on file    Non-medical: Not on file  Tobacco Use  . Smoking status: Never Smoker  . Smokeless tobacco: Never Used  Substance and Sexual Activity  . Alcohol use: No  . Drug use: No  . Sexual activity: Yes    Partners: Female  Lifestyle  . Physical activity:    Days per week: Not on file    Minutes per session: Not on file  . Stress: Not on file  Relationships  . Social connections:    Talks on phone: Not on file    Gets together: Not on file    Attends religious service: Not on file    Active member of club or organization: Not on file    Attends  meetings of clubs or organizations: Not on file    Relationship status: Not on file  . Intimate partner violence:    Fear of current or ex partner: Not on file    Emotionally abused: Not on file    Physically abused: Not on file    Forced sexual activity: Not on file  Other Topics Concern  . Not on file  Social History Narrative  . Not on file   Family History  Problem Relation Age of Onset  . Diabetes Mother   . Hepatitis B Mother   . Liver disease Sister   . Hepatitis B Sister   . Colon  cancer Neg Hx   . Esophageal cancer Neg Hx   . Inflammatory bowel disease Neg Hx   . Pancreatic cancer Neg Hx   . Rectal cancer Neg Hx   . Stomach cancer Neg Hx    I have reviewed his medical, social, and family history in detail and updated the electronic medical record as necessary.    PHYSICAL EXAMINATION  BP 122/86   Pulse 78   Ht 5\' 9"  (1.753 m)   Wt 224 lb 8 oz (101.8 kg)   BMI 33.15 kg/m  Wt Readings from Last 3 Encounters:  09/08/18 224 lb 8 oz (101.8 kg)  07/26/18 221 lb 6 oz (100.4 kg)  07/25/18 221 lb 1.6 oz (100.3 kg)  GEN: NAD, doesn't appear chronically ill PSYCH: Cooperative, without pressured speech EYE: Nonicteric sclerae ENT: MMM CV: RR without R/Gs  RESP: CTAB posteriorly GI: NABS, soft, NT/ND, without rebound or guarding, no HSM appreciated MSK/EXT: No lower extremity edema SKIN: No jaundice or skin rashes NEURO:  Alert & Oriented x 3, no focal deficits, no evidence of asterixis   REVIEW OF DATA  I reviewed the following data at the time of this encounter:  GI Procedures and Studies  No new procedures to review  Laboratory Studies  Reviewed in epic  Imaging Studies  No new studies to review   ASSESSMENT  Mr. Isaac Brown is a 38 y.o. male with a pmh significant for Chronic HBV Infection (HBeAb +), HTN, Leukopenia, H. Pylori Infection (s/p treatment - awaiting eradication results).  The patient is seen today for evaluation and management of:  1. Chronic viral hepatitis B without delta agent and without coma (HCC)   2. Helicobacter pylori infection    The patient is doing well today.  We did discuss that hopefully this go round of salvage H. pylori therapy to get him under control and complete his H. pylori treatment.  If the patient has a recurrent positive stool antigen while having had these 3 rounds of therapy then we will need to refer the patient to infectious disease.  We probably then should move forward with an upper endoscopy to obtain  biopsies in an effort of trying to send a H. pylori culture so that we can try and gain an understanding of resistance patterns for this particular individual H. pylori.  It is not commonly done in the [place] but we would need to touch base with our infectious disease folks or potentially send our biopsies out to another center to be completed we will have to see.  In regards to the patient's history of chronic hepatitis B and prior elastography suggesting higher degree of fibrosis though other laboratories and ALT being normal I do think that as his HBV DNA has been increasing that we will need to consider the possibility of a liver biopsy  unless his HBV DNA has significantly improved even though his ALT is normal.  Because if he truly has F3/F4 fibrosis then he would benefit from earlier therapy to try and decrease the risk of overt cirrhosis down the road.  [facility] July 2020 [facility] and I think it would be worthwhile to do a one-time CT scan since he has had multiple ultrasounds but had an elevation in his AFP when checked more recently.  This will give us a better assessment and ensure no mass or lesion is noted.  The risks of percutaneous liver biopsy including fever, infection, bile leak, bile peritonitis, capsular hematoma were re-discussed with the patient.  All patient questions were answered, to the best of my ability, and the patient agrees to the aforementioned plan of action with follow-up as indicated.   PLAN  Labs as outlined below Based on HBV DNA will need to consider possible liver biopsy Follow-up H. pylori stool antigen and if positive still will require an ID discussion and likely finding a center where we can send the H. pylori biopsies that will need to be obtained via EGD for resistance pattern Liver CT in July 2020   Orders Placed This Encounter  Procedures  . Helicobacter pylori special antigen  . Hepatitis B DNA, ultraquantitative, PCR    New  Prescriptions   No medications on file   Modified Medications   No medications on file    Planned Follow Up: No follow-ups on file.   Corliss ParishGabriel Mansouraty, MD [facility] Office # [phone]

## 2018-09-13 ENCOUNTER — Other Ambulatory Visit: Payer: Self-pay

## 2018-09-13 ENCOUNTER — Telehealth: Payer: Self-pay

## 2018-09-13 ENCOUNTER — Encounter: Payer: Self-pay | Admitting: Gastroenterology

## 2018-09-13 DIAGNOSIS — A048 Other specified bacterial intestinal infections: Secondary | ICD-10-CM

## 2018-09-13 DIAGNOSIS — R1012 Left upper quadrant pain: Secondary | ICD-10-CM

## 2018-09-13 DIAGNOSIS — B181 Chronic viral hepatitis B without delta-agent: Secondary | ICD-10-CM

## 2018-09-13 NOTE — Progress Notes (Signed)
Pt has been informed that he needs to come in for repeat Hepatic Function Panel. Order has been placed.

## 2018-09-13 NOTE — Telephone Encounter (Signed)
-----   Message from Lemar Lofty., MD sent at 09/13/2018  3:41 AM EST ----- Regarding: Labs Perel Hauschild,Mr. Selover will need to come in for a hepatic function panel in the next 1 to 2 weeks.Can you please arrange this.Thank you.GM

## 2018-09-13 NOTE — Telephone Encounter (Signed)
Pt has been informed that he needs hepatic function panel in 1-2 weeks. Pt states that he understands and will come in for labs. Lab order has been entered.

## 2018-10-07 ENCOUNTER — Other Ambulatory Visit (INDEPENDENT_AMBULATORY_CARE_PROVIDER_SITE_OTHER): Payer: 59

## 2018-10-07 DIAGNOSIS — B181 Chronic viral hepatitis B without delta-agent: Secondary | ICD-10-CM | POA: Diagnosis not present

## 2018-10-07 DIAGNOSIS — R1012 Left upper quadrant pain: Secondary | ICD-10-CM | POA: Diagnosis not present

## 2018-10-07 DIAGNOSIS — A048 Other specified bacterial intestinal infections: Secondary | ICD-10-CM

## 2018-10-07 LAB — HEPATIC FUNCTION PANEL
ALK PHOS: 54 U/L (ref 39–117)
ALT: 32 U/L (ref 0–53)
AST: 32 U/L (ref 0–37)
Albumin: 4.5 g/dL (ref 3.5–5.2)
Bilirubin, Direct: 0.2 mg/dL (ref 0.0–0.3)
Total Bilirubin: 1.3 mg/dL — ABNORMAL HIGH (ref 0.2–1.2)
Total Protein: 7.4 g/dL (ref 6.0–8.3)

## 2018-10-11 ENCOUNTER — Telehealth: Payer: Self-pay

## 2018-10-11 NOTE — Telephone Encounter (Signed)
We are still awaiting his Hepatitis B viral numbers for [place] to consider next steps. When those return we will determine next steps. Thanks. GM

## 2018-10-11 NOTE — Telephone Encounter (Signed)
Dr. Meridee Score,   Pt was informed of liver test results. Pt states that he understands. Pt is now wanting to know what steps are needed at this point? Still consider Liver Bx? He does not have follow- up scheduled, but I do have reminder set for July 2020 to set him up for CT of liver. Would you like to wait until then to also have patient to follow- up in office? Please advise.

## 2018-10-11 NOTE — Telephone Encounter (Signed)
-----   Message from Lemar Lofty., MD sent at 10/09/2018  9:13 PM EDT ----- Sharene Skeans, Please let the patient know his liver tests are stable to improved. I will see what the patient's hepatitis levels look like so that we can decide next steps in evaluation and possible role of liver biopsy. Thanks. GM

## 2018-10-12 LAB — HELICOBACTER PYLORI  SPECIAL ANTIGEN
MICRO NUMBER:: 317193
SPECIMEN QUALITY: ADEQUATE

## 2018-10-12 LAB — HEPATITIS B DNA, ULTRAQUANTITATIVE, PCR
HEPATITIS B DNA: 438 [IU]/mL — AB
Hepatitis B DNA (Calc): 2.64 Log IU/mL — ABNORMAL HIGH

## 2018-10-12 NOTE — Telephone Encounter (Signed)
Pt informed. States he understands and will await our call with those results.

## 2018-10-17 ENCOUNTER — Telehealth: Payer: Self-pay

## 2018-10-17 NOTE — Telephone Encounter (Signed)
Pt informed of labs results. Pt is aware that he will need CT scan ( Cirrhosis protocol) in July. He will also need follow- up labs in about 4 months. Pt is aware that I will contact him when it time to get theses labs and CT done in July.

## 2019-02-27 IMAGING — US US ABDOMEN LIMITED
1 series · 14 of 25 positions shown · non-contrast
Comparison: 02/04/2018

CLINICAL DATA: Hepatitis-B

EXAM:
ULTRASOUND ABDOMEN LIMITED RIGHT UPPER QUADRANT

[Series 1: us abdomen limited · 14 of 45 slices shown]
[im 1/45]
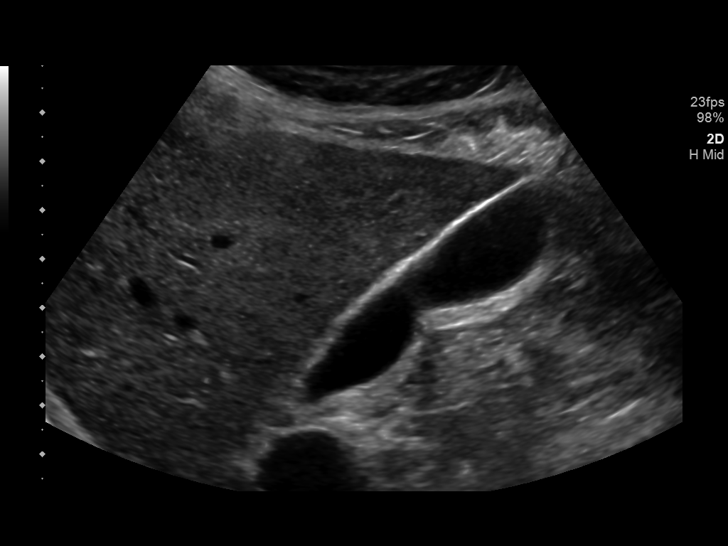
[im 4/45]
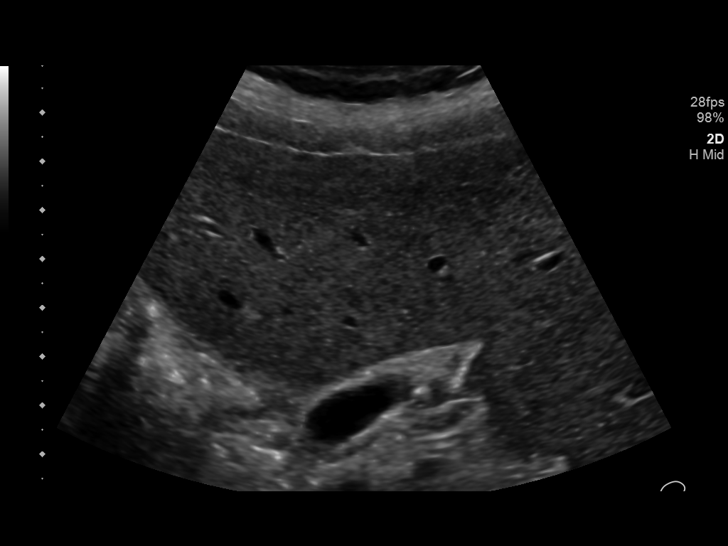
[im 8/45]
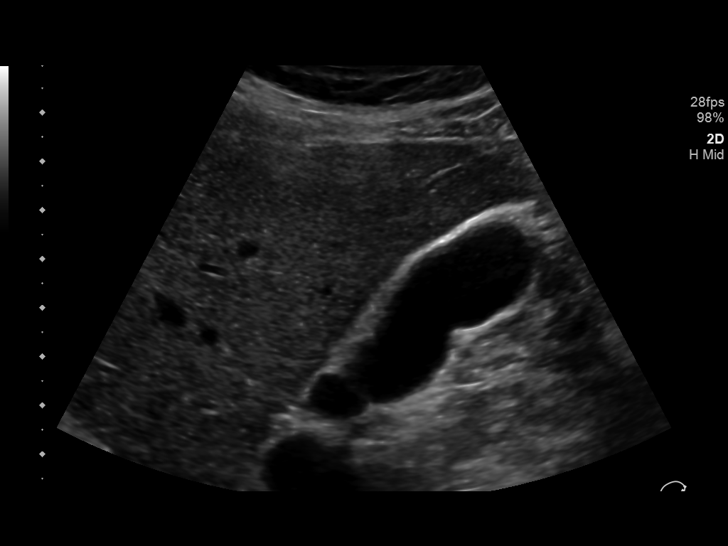
[im 12/45]
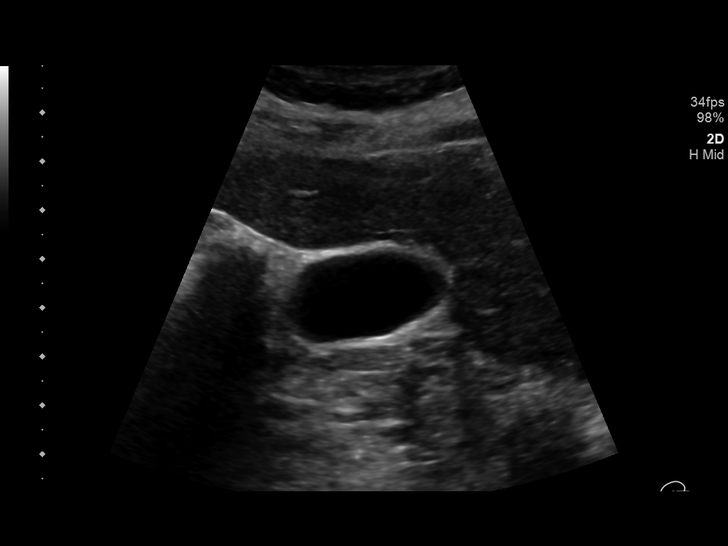
[im 15/45]
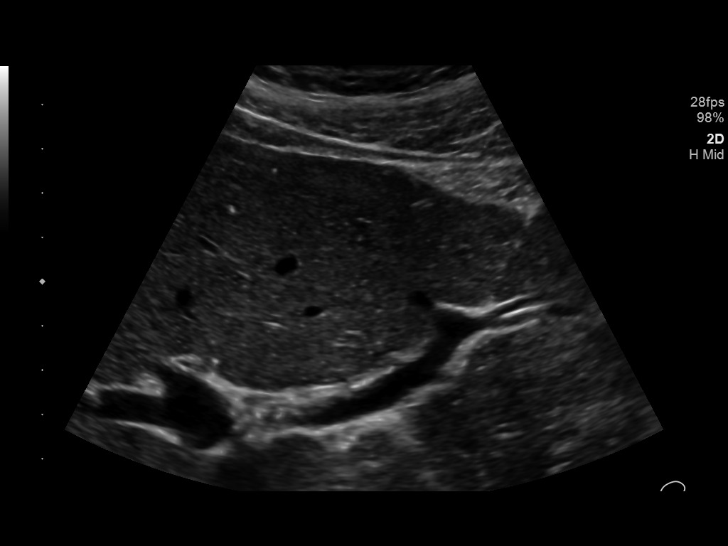
[im 17/45]
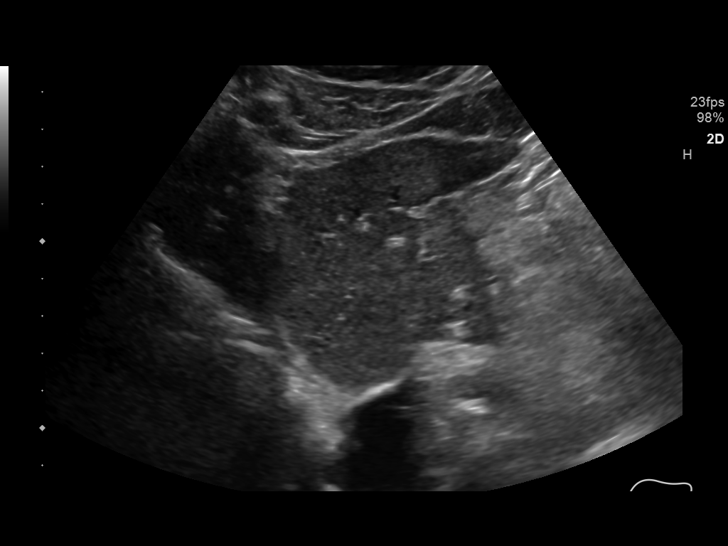
[im 21/45]
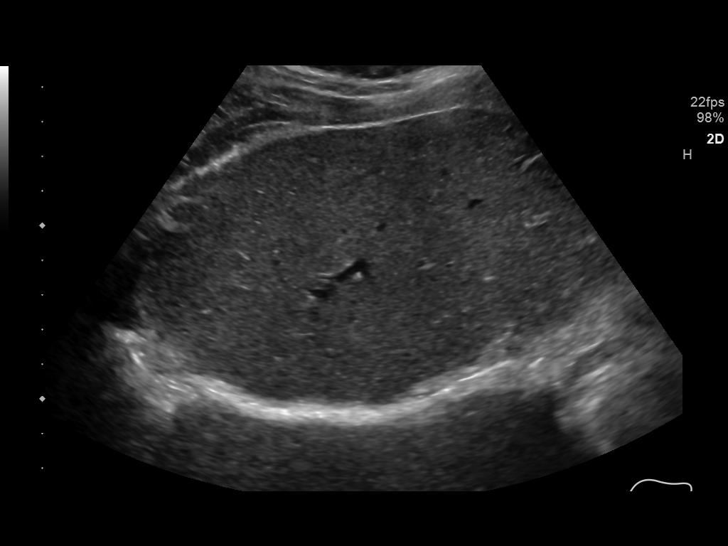
[im 24/45]
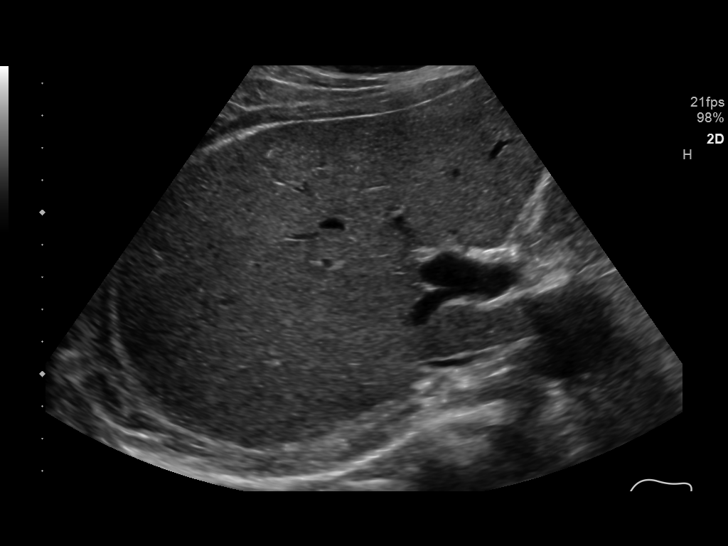
[im 28/45]
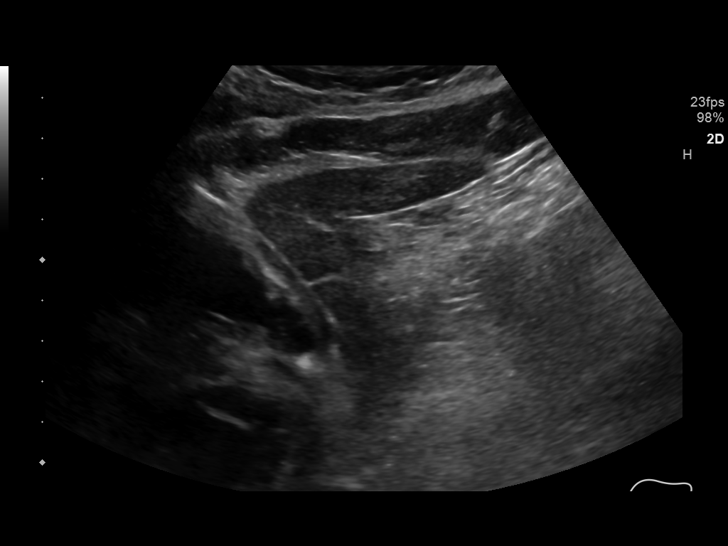
[im 30/45]
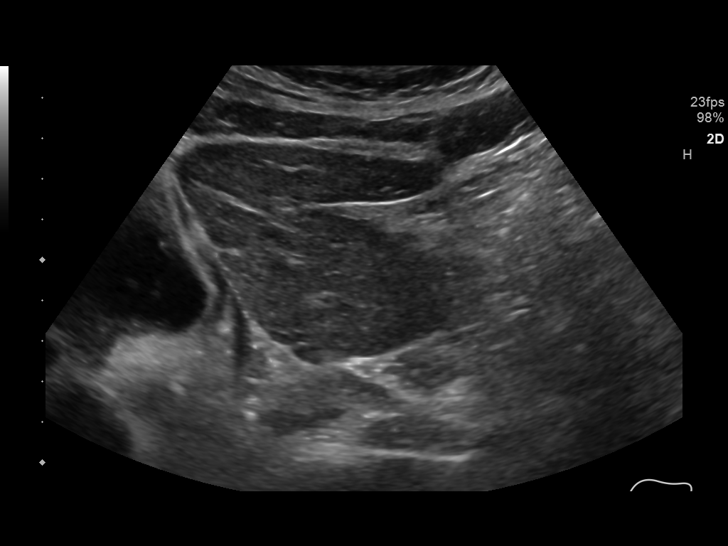
[im 34/45]
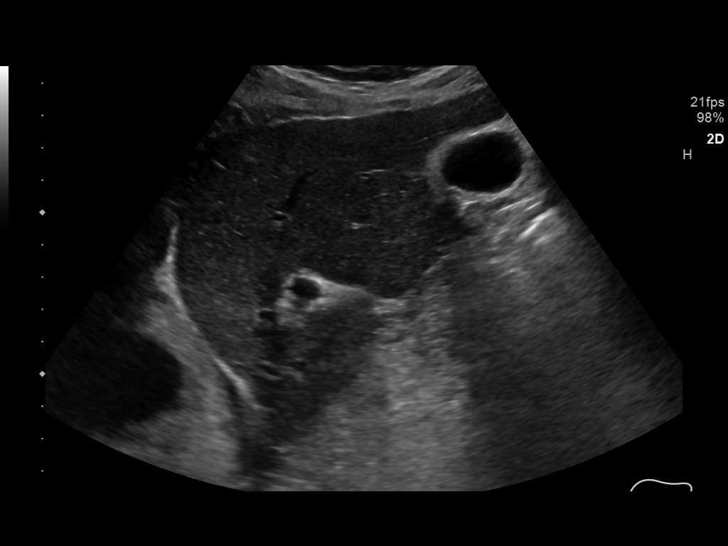
[im 37/45]
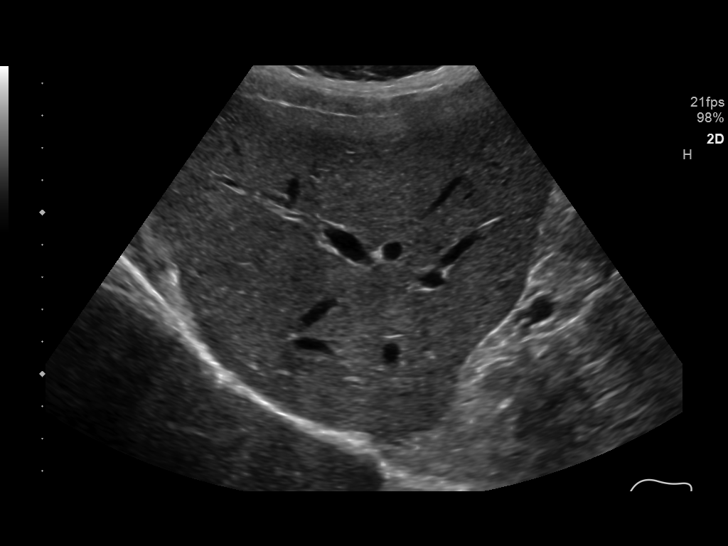
[im 41/45]
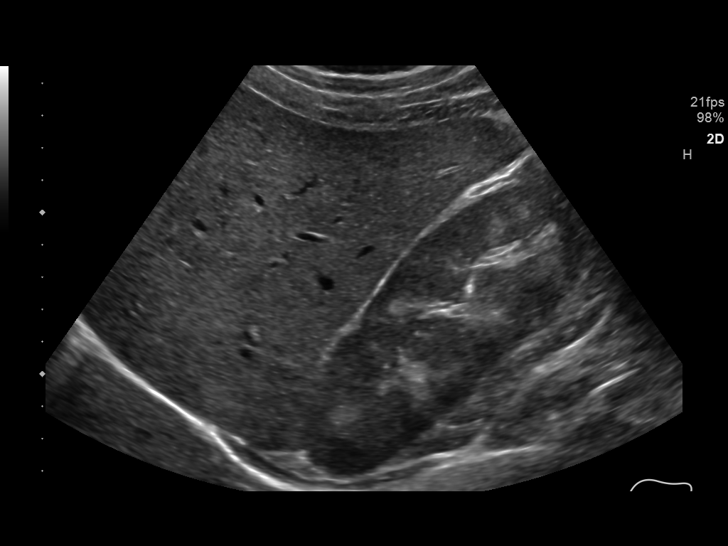
[im 45/45]
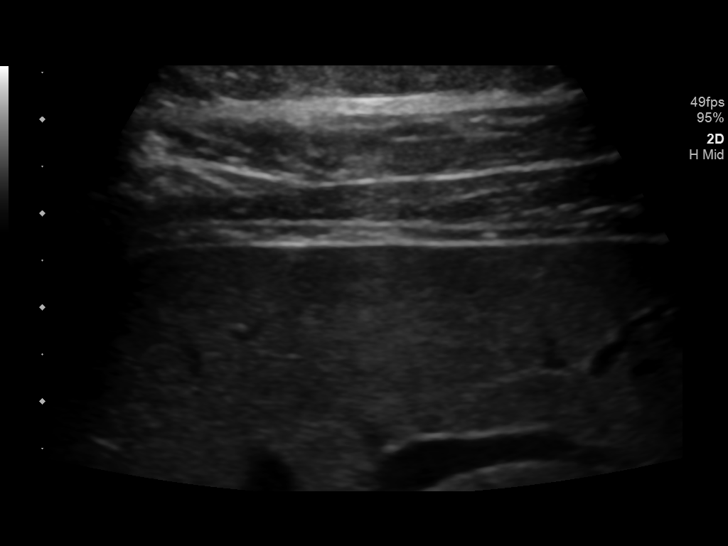

[14 of 25 positions shown; findings below may reference images not displayed]

FINDINGS: Gallbladder:

No gallstones or wall thickening visualized. No sonographic Murphy
sign noted by sonographer.

Common bile duct:

Diameter: Normal caliber, 3 mm

Liver:

No focal lesion identified. Within normal limits in parenchymal
echogenicity. Portal vein is patent on color Doppler imaging with
normal direction of blood flow towards the liver.
IMPRESSION: Unremarkable study.

## 2019-06-28 ENCOUNTER — Other Ambulatory Visit: Payer: Self-pay

## 2019-06-28 DIAGNOSIS — Z20822 Contact with and (suspected) exposure to covid-19: Secondary | ICD-10-CM

## 2019-06-30 LAB — NOVEL CORONAVIRUS, NAA: SARS-CoV-2, NAA: DETECTED — AB

## 2019-07-13 ENCOUNTER — Telehealth: Payer: Self-pay | Admitting: Hematology

## 2019-07-13 NOTE — Telephone Encounter (Signed)
R/s appt per 12/16 sch message - pt aware of new apt date and time

## 2019-07-24 ENCOUNTER — Other Ambulatory Visit: Payer: 59

## 2019-07-24 ENCOUNTER — Ambulatory Visit: Payer: 59 | Admitting: Hematology

## 2019-09-25 ENCOUNTER — Ambulatory Visit: Payer: 59 | Admitting: Hematology

## 2019-09-25 ENCOUNTER — Other Ambulatory Visit: Payer: Self-pay | Admitting: Hematology

## 2019-09-25 ENCOUNTER — Other Ambulatory Visit: Payer: 59

## 2019-09-25 DIAGNOSIS — D72819 Decreased white blood cell count, unspecified: Secondary | ICD-10-CM

## 2019-09-25 DIAGNOSIS — B181 Chronic viral hepatitis B without delta-agent: Secondary | ICD-10-CM

## 2020-03-06 NOTE — Progress Notes (Signed)
Isaac Brown   [phone] Fax:([phone]   Clinic Follow up Note   Patient Care Team: Benito Mccreedy, MD as PCP - General (Internal Medicine) Truitt Merle, MD as Consulting Physician (Hematology) Carlyle Basques, MD as Consulting Physician (Infectious Diseases)  Date of Service:  03/08/2020  CHIEF COMPLAINT: F/u of Leukopenia    CURRENT THERAPY:  Observation   INTERVAL HISTORY:  Isaac Brown is here for a follow up of Leukopenia. He lost f/u after 06/2018. He notes he is doing well. He did see Dr Rush Landmark in 08/2018 and lost follow up afterward due to troubles with scheduling through Customer Service. He notes he did not do Hep B or H Pylorie treatment. I reviewed her medication list with him. He denies recent infections. He notes he received both his COVID19 vaccines. He notes Intermittent and occasional abdominal pain. This is manageable so far.     REVIEW OF SYSTEMS:   Constitutional: Denies fevers, chills or abnormal weight loss Eyes: Denies blurriness of vision Ears, nose, mouth, throat, and face: Denies mucositis or sore throat Respiratory: Denies cough, dyspnea or wheezes Cardiovascular: Denies palpitation, chest discomfort or lower extremity swelling Gastrointestinal:  Denies nausea, heartburn or change in bowel habits (+) Intermittent and occasional abdominal pain Skin: Denies abnormal skin rashes Lymphatics: Denies new lymphadenopathy or easy bruising Neurological:Denies numbness, tingling or new weaknesses Behavioral/Psych: Mood is stable, no new changes  All other systems were reviewed with the patient and are negative.  MEDICAL HISTORY:  Past Medical History:  Diagnosis Date  . Helicobacter pylori infection   . Hepatitis B   . Hypertension   . Liver fibrosis     SURGICAL HISTORY: Past Surgical History:  Procedure Laterality Date  . NO PAST SURGERIES      I have reviewed the social history and family history with the  patient and they are unchanged from previous note.  ALLERGIES:  has No Known Allergies.  MEDICATIONS:  Current Outpatient Medications  Medication Sig Dispense Refill  . amLODipine-benazepril (LOTREL) 10-40 MG capsule Take 1 capsule by mouth daily.    Marland Kitchen aspirin EC 81 MG tablet Take 81 mg by mouth daily.    Marland Kitchen atorvastatin (LIPITOR) 20 MG tablet Take 1 tablet by mouth daily.    Marland Kitchen omeprazole (PRILOSEC) 40 MG capsule Take 1 capsule (40 mg total) by mouth 2 (two) times daily. 60 capsule 0   No current facility-administered medications for this visit.    PHYSICAL EXAMINATION: ECOG PERFORMANCE STATUS: 0 - Asymptomatic  Vitals:   03/08/20 0910  BP: (!) 143/97  Pulse: 87  Resp: 18  Temp: 98.1 F (36.7 C)  SpO2: 100%   Filed Weights   03/08/20 0910  Weight: 227 lb 3.2 oz (103.1 kg)    Due to COVID19 we will limit examination to appearance. Patient had no complaints.  GENERAL:alert, no distress and comfortable SKIN: skin color normal, no rashes or significant lesions EYES: normal, Conjunctiva are pink and non-injected, sclera clear  NEURO: alert & oriented x 3 with fluent speech   LABORATORY DATA:  I have reviewed the data as listed CBC Latest Ref Rng & Units 03/08/2020 07/26/2018 07/25/2018  WBC 4.0 - 10.5 K/uL 3.2(L) 3.1(L) 3.1(L)  Hemoglobin 13.0 - 17.0 g/dL 14.9 15.8 15.1  Hematocrit 39 - 52 % 43.3 46.8 44.7  Platelets 150 - 400 K/uL 170 153.0 172     CMP Latest Ref Rng & Units 03/08/2020 10/07/2018 07/26/2018  Glucose 70 - 99 mg/dL 92 -  97  BUN 6 - 20 mg/dL 13 - 15  Creatinine 2.48 - 1.24 mg/dL 4.94(M) - 3.55  Sodium 135 - 145 mmol/L 139 - 138  Potassium 3.5 - 5.1 mmol/L 3.6 - 3.6  Chloride 98 - 111 mmol/L 103 - 102  CO2 22 - 32 mmol/L 26 - 28  Calcium 8.9 - 10.3 mg/dL 9.2 - 8.8  Total Protein 6.5 - 8.1 g/dL 7.6 7.4 7.6  Total Bilirubin 0.3 - 1.2 mg/dL 9.9(N) 1.3(H) 1.6(H)  Alkaline Phos 38 - 126 U/L 77 54 61  AST 15 - 41 U/L 32 32 29  ALT 0 - 44 U/L 35 32 32       RADIOGRAPHIC STUDIES: I have personally reviewed the radiological images as listed and agreed with the findings in the report. No results found.   ASSESSMENT & PLAN:  Isaac Brown is a 39 y.o. male with    1. Leukopenia,with dominant mild neutropenia -He has had mild leukopenia, with predominant borderline neutropenia since at least 2014 when he was seen by Dr. Drue Second for hepatitis B infection. No history of recurrent infection. Asymptomatic. His initial 01/2018 US abdomen showed no hepatosplenomegaly and 07/2018 [place] was unremarkable.  -I think his neutropenia is likely related to his chronic hepatitis B, or normal variant due tohis ethnic group. There is no inclination of a bone marrow biopsy at this time.  -He lost f/u after 06/2018. He was seen by Dr Meridee Score in 08/2018 and lost f/u as well.  -He denies any recent infections. He plans to proceed with H. Pylorie and Heb B treatment with Dr Meridee Score.  -He is due for CT AP and f/u with Dr Meridee Score. I will order scan today to be done in next 2-4 weeks .  -labs reviewed, WBC 3.2, ANC 1.2. I discussed given mild and stable levels he can continue observation. No treatment is indicated.  -He can continue observation with his PCP along with his overall health. He is agreeable.  -I will f/u with him as needed in the future.    2.Chronic hepatitis B, untreated -He was last seen by Dr. Ilsa Iha in 2013, treatment was not offered at that time. He knows to avoid alcohol given his liver disease.  Dr. Drue Second who referred him to GI Dr. Meridee Score who recommends a Liver biopsy to determine if treatment is necessary.  -He has not had Hep B or H.Pylorie treatment. I recommend he f/u with Dr Meridee Score about this.  -He is due for liver cancer screening and will proceed with CT AP which in the next 1-2 months. AFP still pending.    3. HTN and Cancer Screening  -BP at 143/97 today (03/08/20). He notes he did go to gym this morning.   -Follow-up with primary care physician and continue medication and managing his overall health  -I recommend he proceed with age appropriate cancer screening as he reaches his 62s.    4. H/o COVID19 detected on test in 06/2019. He has completed COVID19 vaccine series.    Plan: -CT abdomen w contrast (liver protocol) in 1-2 months  -F/u with Dr Meridee Score after CT  -f/u with me as needed in the future  No problem-specific Assessment & Plan notes found for this encounter.   Orders Placed This Encounter  Procedures  . CT Abdomen W Contrast    Liver protocol for liver cancer screening    Standing Status:   Future    Standing Expiration Date:   03/08/2021    Order Specific  Question:   If indicated for the ordered procedure, I authorize the administration of contrast media per Radiology protocol    Answer:   Yes    Order Specific Question:   Preferred imaging location?    Answer:   [facility]    Order Specific Question:   Release to patient    Answer:   Immediate    Order Specific Question:   Is Oral Contrast requested for this exam?    Answer:   Yes, Per Radiology protocol    Order Specific Question:   Radiology Contrast Protocol - do NOT remove file path    Answer:   [email or web address]   All questions were answered. The patient knows to call the clinic with any problems, questions or concerns. No barriers to learning was detected. The total time spent in the appointment was 25 minutes.     Truitt Merle, MD 03/08/2020   I, Joslyn Devon, am acting as scribe for Truitt Merle, MD.   I have reviewed the above documentation for accuracy and completeness, and I agree with the above.

## 2020-03-08 ENCOUNTER — Encounter: Payer: Self-pay | Admitting: Hematology

## 2020-03-08 ENCOUNTER — Inpatient Hospital Stay (HOSPITAL_BASED_OUTPATIENT_CLINIC_OR_DEPARTMENT_OTHER): Payer: 59 | Admitting: Hematology

## 2020-03-08 ENCOUNTER — Inpatient Hospital Stay: Payer: 59 | Attending: Hematology

## 2020-03-08 ENCOUNTER — Other Ambulatory Visit: Payer: Self-pay

## 2020-03-08 VITALS — BP 143/97 | HR 87 | Temp 98.1°F | Resp 18 | Ht 69.0 in | Wt 227.2 lb

## 2020-03-08 DIAGNOSIS — I10 Essential (primary) hypertension: Secondary | ICD-10-CM | POA: Diagnosis not present

## 2020-03-08 DIAGNOSIS — B181 Chronic viral hepatitis B without delta-agent: Secondary | ICD-10-CM

## 2020-03-08 DIAGNOSIS — Z79899 Other long term (current) drug therapy: Secondary | ICD-10-CM | POA: Diagnosis not present

## 2020-03-08 DIAGNOSIS — D72819 Decreased white blood cell count, unspecified: Secondary | ICD-10-CM | POA: Diagnosis not present

## 2020-03-08 LAB — CBC WITH DIFFERENTIAL (CANCER CENTER ONLY)
Abs Immature Granulocytes: 0 10*3/uL (ref 0.00–0.07)
Basophils Absolute: 0 10*3/uL (ref 0.0–0.1)
Basophils Relative: 1 %
Eosinophils Absolute: 0.1 10*3/uL (ref 0.0–0.5)
Eosinophils Relative: 2 %
HCT: 43.3 % (ref 39.0–52.0)
Hemoglobin: 14.9 g/dL (ref 13.0–17.0)
Immature Granulocytes: 0 %
Lymphocytes Relative: 46 %
Lymphs Abs: 1.5 10*3/uL (ref 0.7–4.0)
MCH: 28.5 pg (ref 26.0–34.0)
MCHC: 34.4 g/dL (ref 30.0–36.0)
MCV: 83 fL (ref 80.0–100.0)
Monocytes Absolute: 0.4 10*3/uL (ref 0.1–1.0)
Monocytes Relative: 13 %
Neutro Abs: 1.2 10*3/uL — ABNORMAL LOW (ref 1.7–7.7)
Neutrophils Relative %: 38 %
Platelet Count: 170 10*3/uL (ref 150–400)
RBC: 5.22 MIL/uL (ref 4.22–5.81)
RDW: 13.4 % (ref 11.5–15.5)
WBC Count: 3.2 10*3/uL — ABNORMAL LOW (ref 4.0–10.5)
nRBC: 0 % (ref 0.0–0.2)

## 2020-03-08 LAB — CMP (CANCER CENTER ONLY)
ALT: 35 U/L (ref 0–44)
AST: 32 U/L (ref 15–41)
Albumin: 4.1 g/dL (ref 3.5–5.0)
Alkaline Phosphatase: 77 U/L (ref 38–126)
Anion gap: 10 (ref 5–15)
BUN: 13 mg/dL (ref 6–20)
CO2: 26 mmol/L (ref 22–32)
Calcium: 9.2 mg/dL (ref 8.9–10.3)
Chloride: 103 mmol/L (ref 98–111)
Creatinine: 1.29 mg/dL — ABNORMAL HIGH (ref 0.61–1.24)
GFR, Est AFR Am: >60 mL/min (ref >60)
GFR, Estimated: >60 mL/min (ref >60)
Glucose, Bld: 92 mg/dL (ref 70–99)
Potassium: 3.6 mmol/L (ref 3.5–5.1)
Sodium: 139 mmol/L (ref 135–145)
Total Bilirubin: 1.8 mg/dL — ABNORMAL HIGH (ref 0.3–1.2)
Total Protein: 7.6 g/dL (ref 6.5–8.1)

## 2020-03-09 ENCOUNTER — Other Ambulatory Visit: Payer: Self-pay | Admitting: Hematology

## 2020-03-09 ENCOUNTER — Encounter: Payer: Self-pay | Admitting: Hematology

## 2020-03-09 LAB — AFP TUMOR MARKER: AFP, Serum, Tumor Marker: 8.6 ng/mL — ABNORMAL HIGH (ref 0.0–8.3)

## 2020-03-11 ENCOUNTER — Telehealth: Payer: Self-pay | Admitting: Hematology

## 2020-03-11 ENCOUNTER — Telehealth: Payer: Self-pay

## 2020-03-11 NOTE — Telephone Encounter (Signed)
Appt made for follow up of chronic Hep B on 05/03/20 at 310 pm the pt has been advised via My Chart. Confirmed the pt did view the appt information

## 2020-03-11 NOTE — Telephone Encounter (Signed)
-----   Message from Lemar Lofty., MD sent at 03/09/2020  8:52 PM EDT ----- Massie Bougie for reaching out.Daniela Siebers can you schedule this patient to follow-up with me in my next available slot in clinic.Thanks.GM ----- Message ----- From: Malachy Mood, MD Sent: 03/08/2020   9:47 AM EDT To: Lemar Lofty., MD  Gabe,  He did not follow up with you after his last visit in 08/2018. I saw him today, he is doing fine. I ordered liver CT as you recommended on your last visit, please see him back after his CT (to be scheduled)  Thanks  Terrace Arabia

## 2020-03-11 NOTE — Telephone Encounter (Signed)
F/u as needed per 8/13 los

## 2020-03-22 ENCOUNTER — Ambulatory Visit (HOSPITAL_COMMUNITY): Payer: 59

## 2020-03-27 DIAGNOSIS — B1910 Unspecified viral hepatitis B without hepatic coma: Principal | ICD-10-CM

## 2020-05-03 ENCOUNTER — Ambulatory Visit: Payer: 59 | Admitting: Gastroenterology

## 2020-05-28 ENCOUNTER — Ambulatory Visit: Admit: 2020-05-28 | Discharge: 2020-05-29 | Payer: PRIVATE HEALTH INSURANCE | Attending: Family | Primary: Family

## 2020-05-28 DIAGNOSIS — B181 Chronic viral hepatitis B without delta-agent: Principal | ICD-10-CM

## 2020-05-28 DIAGNOSIS — B1910 Unspecified viral hepatitis B without hepatic coma: Principal | ICD-10-CM

## 2020-05-28 DIAGNOSIS — Z1321 Encounter for screening for nutritional disorder: Principal | ICD-10-CM

## 2020-06-02 DIAGNOSIS — B181 Chronic viral hepatitis B without delta-agent: Principal | ICD-10-CM

## 2020-06-06 ENCOUNTER — Other Ambulatory Visit: Payer: Self-pay | Admitting: Family

## 2020-06-06 DIAGNOSIS — B181 Chronic viral hepatitis B without delta-agent: Secondary | ICD-10-CM

## 2020-06-28 ENCOUNTER — Ambulatory Visit
Admission: RE | Admit: 2020-06-28 | Discharge: 2020-06-28 | Disposition: A | Discharge status: Home / Self Care | Payer: 59 | Source: Ambulatory Visit | Attending: Family | Admitting: Family

## 2020-06-28 DIAGNOSIS — B181 Chronic viral hepatitis B without delta-agent: Secondary | ICD-10-CM

## 2020-07-02 ENCOUNTER — Ambulatory Visit: Payer: 59 | Admitting: Gastroenterology

## 2020-11-26 ENCOUNTER — Ambulatory Visit: Admit: 2020-11-26 | Discharge: 2020-11-26 | Payer: PRIVATE HEALTH INSURANCE

## 2020-11-26 ENCOUNTER — Ambulatory Visit: Admit: 2020-11-26 | Discharge: 2020-11-26 | Payer: PRIVATE HEALTH INSURANCE | Attending: Family | Primary: Family

## 2020-11-26 DIAGNOSIS — B181 Chronic viral hepatitis B without delta-agent: Principal | ICD-10-CM

## 2021-01-13 IMAGING — US US ABDOMEN COMPLETE
1 series · 14 of 25 positions shown · non-contrast
Comparison: 08/12/2018 abdominal sonogram.

CLINICAL DATA: Chronic hepatitis-B. Right abdominal pain for 1
month.

EXAM:
ABDOMEN ULTRASOUND COMPLETE

[Series 1: us abdomen complete · 0.19mm/px · 14 of 90 slices shown]
[im 1/90]
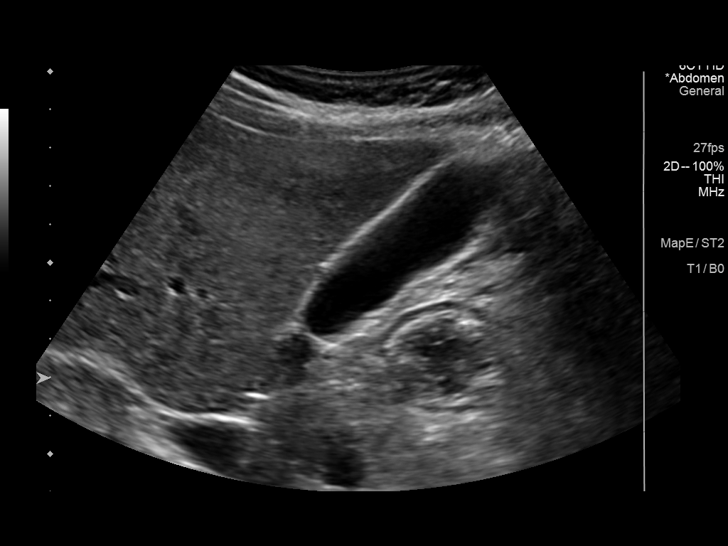
[im 8/90]
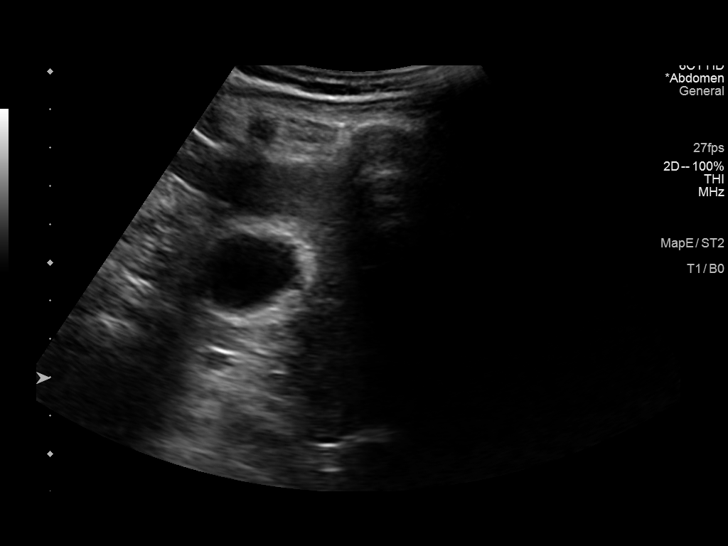
[im 15/90]
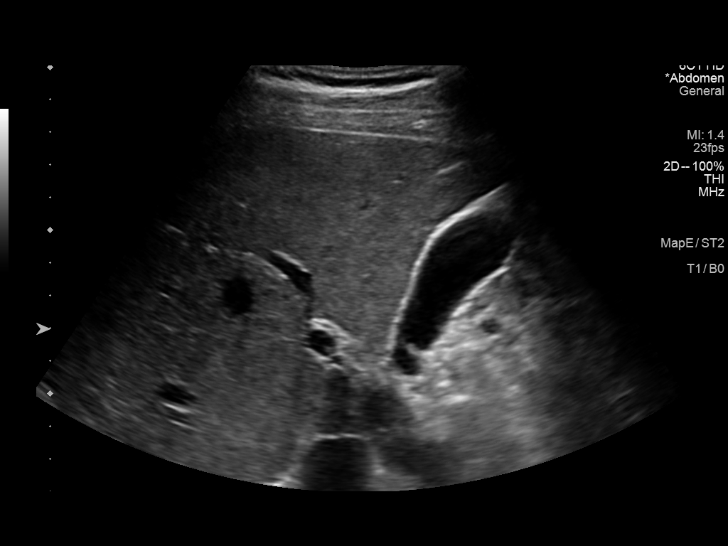
[im 23/90]
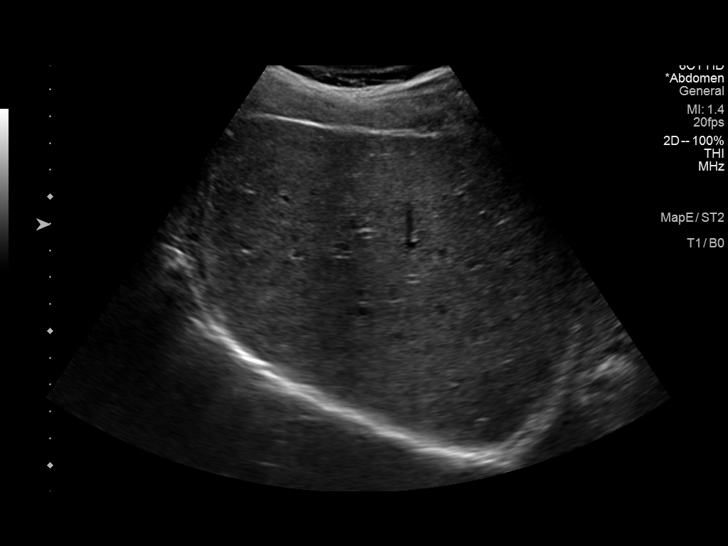
[im 30/90]
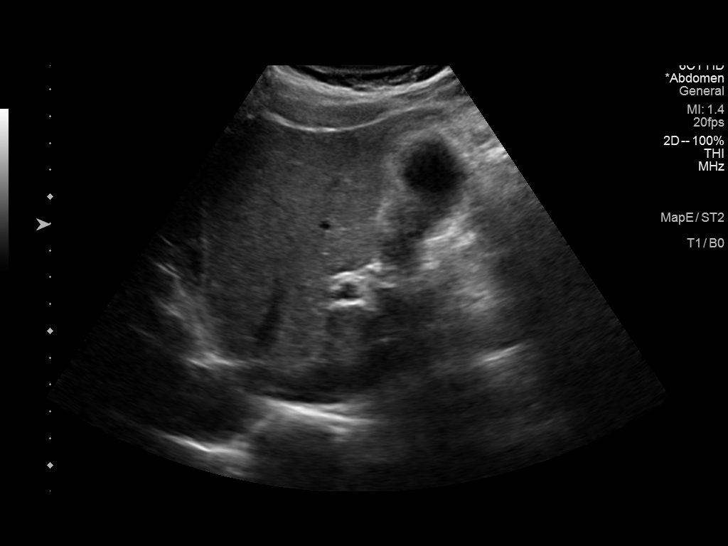
[im 34/90]
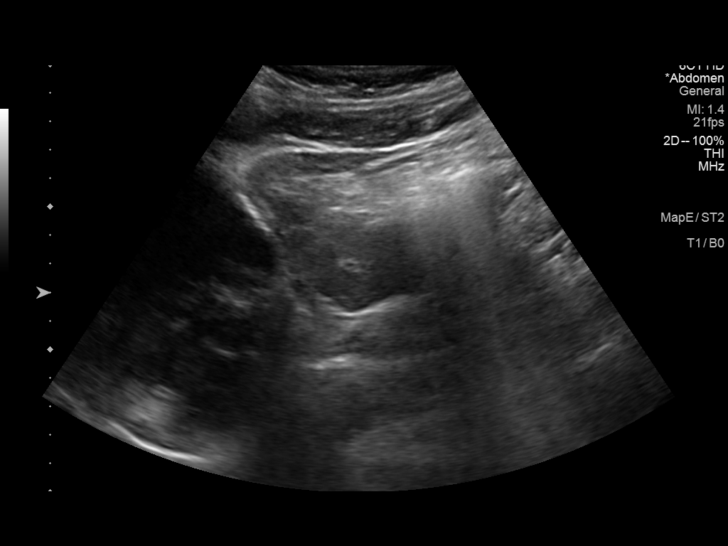
[im 41/90]
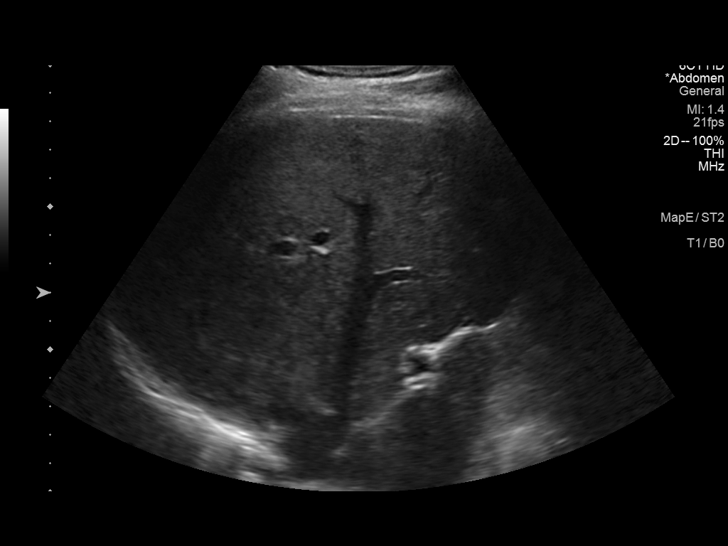
[im 49/90]
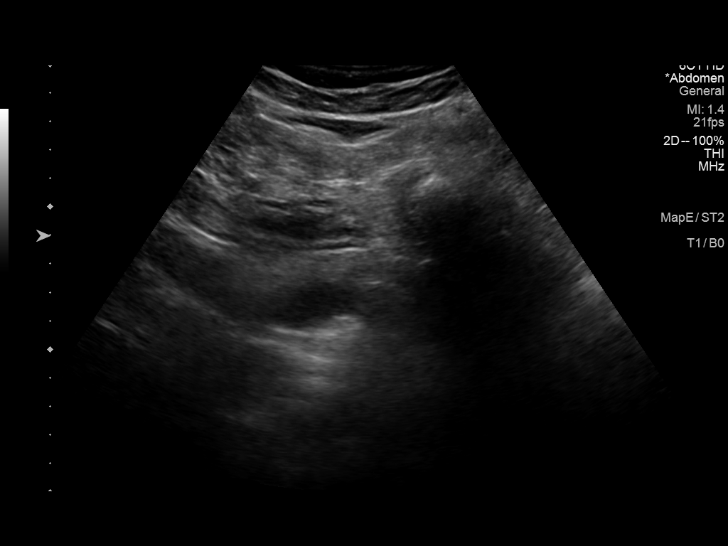
[im 56/90]
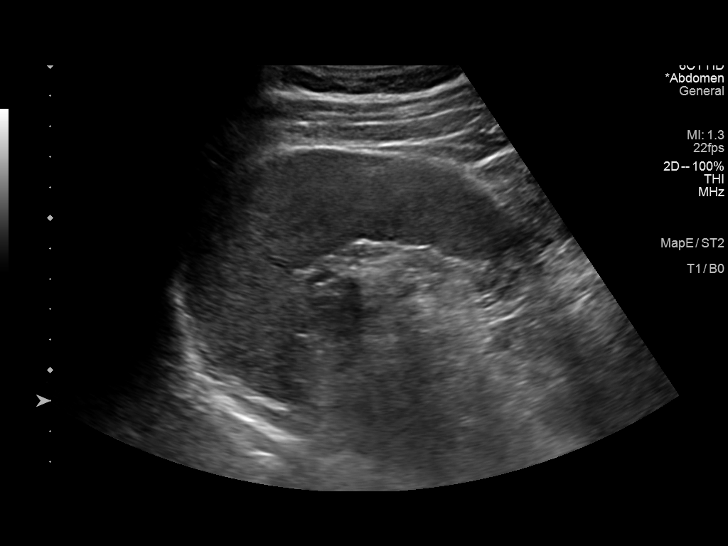
[im 60/90]
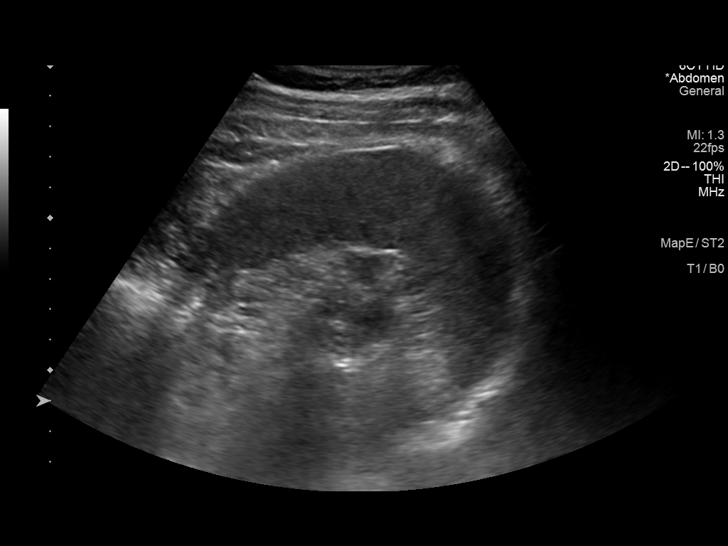
[im 67/90]
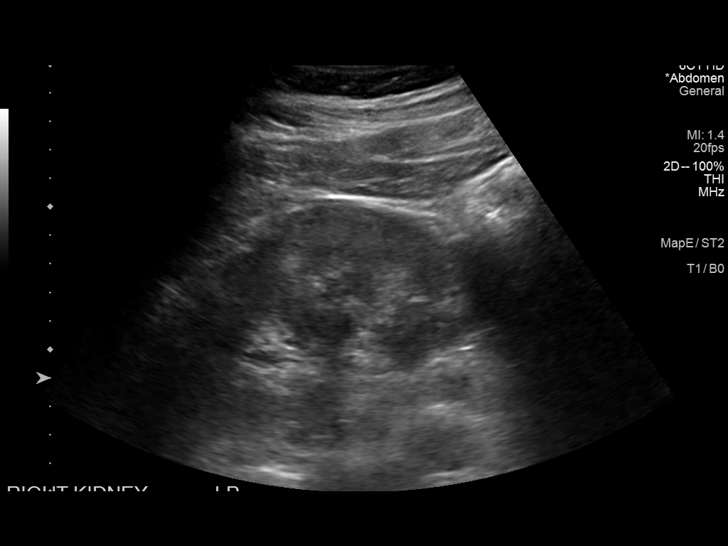
[im 75/90]
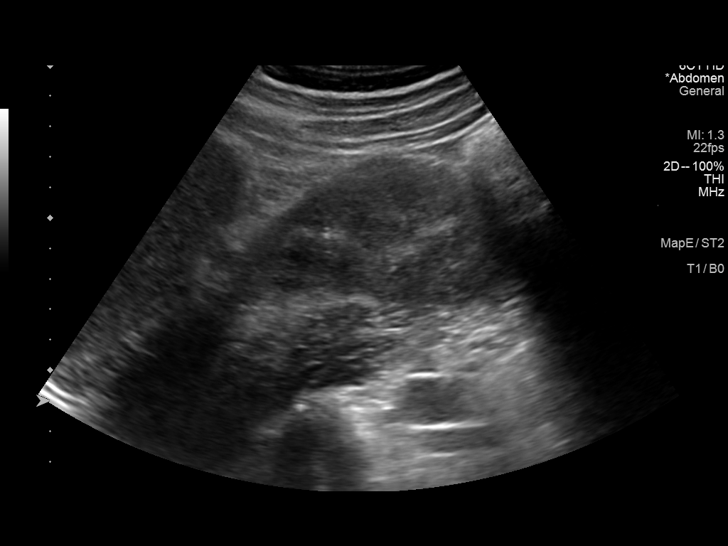
[im 82/90]
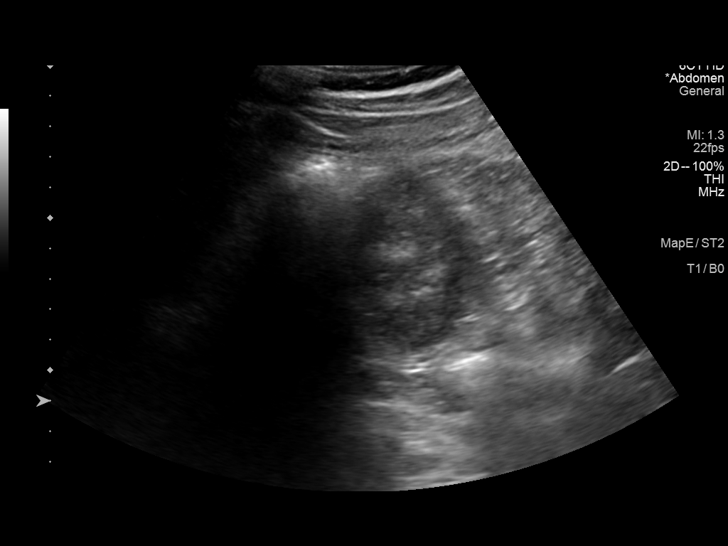
[im 90/90]
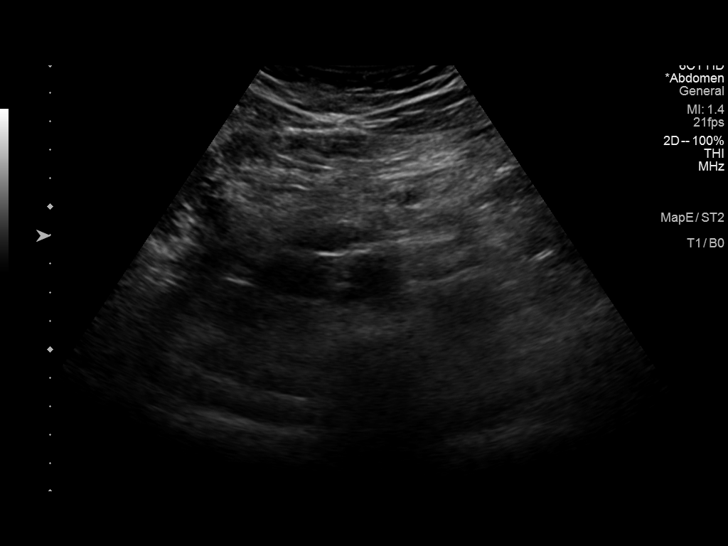

[14 of 25 positions shown; findings below may reference images not displayed]

FINDINGS: Gallbladder: No gallstones or wall thickening visualized. No
sonographic Murphy sign noted by sonographer.

Common bile duct: Diameter: 2 mm

Liver: No focal lesion identified. Within normal limits in
parenchymal echogenicity. Portal vein is patent on color Doppler
imaging with normal direction of blood flow towards the liver.

IVC: No abnormality visualized.

Pancreas: Limited visualized portion unremarkable.

Spleen: Size and appearance within normal limits.

Right Kidney: Length: 12.1 cm. Echogenicity within normal limits. No
mass or hydronephrosis visualized.

Left Kidney: Length: 11.2 cm. Echogenicity within normal limits. No
mass or hydronephrosis visualized.

Abdominal aorta: No aneurysm visualized.

Other findings: None.
IMPRESSION: Normal abdominal sonogram.  No liver masses.

## 2021-02-28 ENCOUNTER — Telehealth: Payer: Self-pay | Admitting: Hematology

## 2021-02-28 NOTE — Telephone Encounter (Signed)
Scheduled appt per 8/4 sch msg. Pt aware.  

## 2021-03-28 ENCOUNTER — Inpatient Hospital Stay: Payer: 59 | Attending: Hematology | Admitting: Hematology

## 2021-03-28 DIAGNOSIS — B181 Chronic viral hepatitis B without delta-agent: Secondary | ICD-10-CM | POA: Insufficient documentation

## 2021-03-28 DIAGNOSIS — I10 Essential (primary) hypertension: Secondary | ICD-10-CM | POA: Insufficient documentation

## 2021-03-28 DIAGNOSIS — D72819 Decreased white blood cell count, unspecified: Secondary | ICD-10-CM | POA: Insufficient documentation

## 2021-03-28 DIAGNOSIS — Z8616 Personal history of COVID-19: Secondary | ICD-10-CM | POA: Insufficient documentation

## 2021-04-08 ENCOUNTER — Other Ambulatory Visit: Payer: Self-pay

## 2021-04-08 ENCOUNTER — Encounter: Payer: Self-pay | Admitting: Hematology

## 2021-04-08 ENCOUNTER — Inpatient Hospital Stay (HOSPITAL_BASED_OUTPATIENT_CLINIC_OR_DEPARTMENT_OTHER): Payer: 59 | Admitting: Hematology

## 2021-04-08 VITALS — BP 135/90 | HR 81 | Temp 98.4°F | Resp 17 | Ht 69.0 in | Wt 219.4 lb

## 2021-04-08 DIAGNOSIS — Z8616 Personal history of COVID-19: Secondary | ICD-10-CM | POA: Diagnosis not present

## 2021-04-08 DIAGNOSIS — I10 Essential (primary) hypertension: Secondary | ICD-10-CM | POA: Diagnosis not present

## 2021-04-08 DIAGNOSIS — D72819 Decreased white blood cell count, unspecified: Secondary | ICD-10-CM | POA: Diagnosis not present

## 2021-04-08 DIAGNOSIS — B181 Chronic viral hepatitis B without delta-agent: Secondary | ICD-10-CM | POA: Diagnosis not present

## 2021-04-08 NOTE — Progress Notes (Signed)
[place]   [phone] Fax:(336) [phone]   Clinic Follow up Note   Patient Care Team: Benito Mccreedy, MD as PCP - General (Internal Medicine) Truitt Merle, MD as Consulting Physician (Hematology) Carlyle Basques, MD as Consulting Physician (Infectious Diseases) Mansouraty, Telford Nab., MD as Consulting Physician (Gastroenterology)  Date of Service:  04/08/2021  CHIEF COMPLAINT: f/u of leukopenia  CURRENT THERAPY:  Observation  ASSESSMENT & PLAN:  Isaac Brown is a 40 y.o. male with   1. Leukopenia, with dominant mild neutropenia -He has had mild leukopenia, with predominant borderline neutropenia since at least 2014 when he was seen by Dr. Baxter Flattery for hepatitis B infection.  No history of recurrent infection.  Asymptomatic. His initial 01/2018 US abdomen showed no hepatosplenomegaly and 07/2018 [place] was unremarkable.  -I think his neutropenia is likely related to his chronic hepatitis B, or normal variant due to his ethnic group. There is no indication of a bone marrow biopsy at this time.  -he was referred back to [place] today by his PCP for persistent low WBC -He denies any recent infections. He is scheduled to f/u with Dr. Scarlette Shorts in GI at [facility] in 05/2021. -labs from Dr. Vista Lawman reviewed, remains overall mild and stable. -My recommendations remains same, no additional work-up needed.   2.  Chronic hepatitis B, untreated -He was last seen by Dr. Graylon Good in 2013, treatment was not offered at that time. He knows to avoid alcohol given his liver disease.  Dr. Baxter Flattery who referred him to GI Dr. Rush Landmark who recommends a Liver biopsy to determine if treatment is necessary.  -He has not had Hep B or H.Pylorie treatment. I recommend he f/u with Dr Rush Landmark about this.  -most recent abdomen US on 11/26/20 at [facility] was normal.   3. HTN and Cancer Screening  -BP at 143/97 today (03/08/20). He notes he did go to gym this morning.  -Follow-up with primary care  physician and continue medication and managing his overall health  -I recommend he proceed with age appropriate cancer screening as he reaches his 64s.    4. H/o COVID19 detected on test in 06/2019. He has completed COVID19 vaccine series.      Plan: -He has mild leukopenia is mild and stable, no additional work-up or treatment needed. -f/u with me as needed in the future   No problem-specific Assessment & Plan notes found for this encounter.   INTERVAL HISTORY:  Isaac Brown is here for a follow up of leukopenia. He was last seen by me on 03/08/20. He presents to the clinic alone. He reports feeling well overall. He notes he has the same family doctor. He reports his only concern is some intermittent sharp pains to the sides of his abdomen. He denies any bowel concerns.   All other systems were reviewed with the patient and are negative.  MEDICAL HISTORY:  Past Medical History:  Diagnosis Date   Helicobacter pylori infection    Hepatitis B    Hypertension    Liver fibrosis     SURGICAL HISTORY: Past Surgical History:  Procedure Laterality Date   NO PAST SURGERIES      I have reviewed the social history and family history with the patient and they are unchanged from previous note.  ALLERGIES:  has No Known Allergies.  MEDICATIONS:  Current Outpatient Medications  Medication Sig Dispense Refill   amLODipine-benazepril (LOTREL) 10-40 MG capsule Take 1 capsule by mouth daily.     aspirin EC 81 MG  tablet Take 81 mg by mouth daily.     atorvastatin (LIPITOR) 20 MG tablet Take 1 tablet by mouth daily.     No current facility-administered medications for this visit.    PHYSICAL EXAMINATION: ECOG PERFORMANCE STATUS: 0 - Asymptomatic  Vitals:   04/08/21 1158  BP: 135/90  Pulse: 81  Resp: 17  Temp: 98.4 F (36.9 C)  SpO2: 99%   Wt Readings from Last 3 Encounters:  04/08/21 219 lb 6.4 oz (99.5 kg)  03/08/20 227 lb 3.2 oz (103.1 kg)  09/08/18 224 lb 8 oz (101.8 kg)      GENERAL:alert, no distress and comfortable SKIN: skin color, texture, turgor are normal, no rashes or significant lesions EYES: normal, Conjunctiva are pink and non-injected, sclera clear  NECK: supple, thyroid normal size, non-tender, without nodularity LYMPH:  no palpable lymphadenopathy in the cervical, axillary  LUNGS: clear to auscultation and percussion with normal breathing effort HEART: regular rate & rhythm and no murmurs and no lower extremity edema ABDOMEN:abdomen soft, non-tender and normal bowel sounds Musculoskeletal:no cyanosis of digits and no clubbing  NEURO: alert & oriented x 3 with fluent speech, no focal motor/sensory deficits  LABORATORY DATA:  I have reviewed the data as listed CBC Latest Ref Rng & Units 03/08/2020 07/26/2018 07/25/2018  WBC 4.0 - 10.5 K/uL 3.2(L) 3.1(L) 3.1(L)  Hemoglobin 13.0 - 17.0 g/dL 14.9 15.8 15.1  Hematocrit 39.0 - 52.0 % 43.3 46.8 44.7  Platelets 150 - 400 K/uL 170 153.0 172     CMP Latest Ref Rng & Units 03/08/2020 10/07/2018 07/26/2018  Glucose 70 - 99 mg/dL 92 - 97  BUN 6 - 20 mg/dL 13 - 15  Creatinine 0.61 - 1.24 mg/dL 1.29(H) - 1.04  Sodium 135 - 145 mmol/L 139 - 138  Potassium 3.5 - 5.1 mmol/L 3.6 - 3.6  Chloride 98 - 111 mmol/L 103 - 102  CO2 22 - 32 mmol/L 26 - 28  Calcium 8.9 - 10.3 mg/dL 9.2 - 8.8  Total Protein 6.5 - 8.1 g/dL 7.6 7.4 7.6  Total Bilirubin 0.3 - 1.2 mg/dL 1.8(H) 1.3(H) 1.6(H)  Alkaline Phos 38 - 126 U/L 77 54 61  AST 15 - 41 U/L 32 32 29  ALT 0 - 44 U/L 35 32 32      RADIOGRAPHIC STUDIES: I have personally reviewed the radiological images as listed and agreed with the findings in the report. No results found.    No orders of the defined types were placed in this encounter.  All questions were answered. The patient knows to call the clinic with any problems, questions or concerns. No barriers to learning was detected. The total time spent in the appointment was 20 minutes.     Truitt Merle,  MD 04/08/2021   I, Wilburn Mylar, am acting as scribe for Truitt Merle, MD.   I have reviewed the above documentation for accuracy and completeness, and I agree with the above.

## 2021-06-09 ENCOUNTER — Ambulatory Visit: Admit: 2021-06-09 | Discharge: 2021-06-10 | Payer: PRIVATE HEALTH INSURANCE

## 2021-06-09 DIAGNOSIS — B181 Chronic viral hepatitis B without delta-agent: Principal | ICD-10-CM

## 2021-07-25 ENCOUNTER — Ambulatory Visit: Admit: 2021-07-25 | Discharge: 2021-07-25 | Payer: PRIVATE HEALTH INSURANCE

## 2022-01-05 DIAGNOSIS — Z136 Encounter for screening for cardiovascular disorders: Secondary | ICD-10-CM | POA: Diagnosis not present

## 2022-01-05 DIAGNOSIS — I10 Essential (primary) hypertension: Secondary | ICD-10-CM | POA: Diagnosis not present

## 2022-01-05 DIAGNOSIS — R7303 Prediabetes: Secondary | ICD-10-CM | POA: Diagnosis not present

## 2022-01-05 DIAGNOSIS — E559 Vitamin D deficiency, unspecified: Secondary | ICD-10-CM | POA: Diagnosis not present

## 2022-01-05 DIAGNOSIS — Z131 Encounter for screening for diabetes mellitus: Secondary | ICD-10-CM | POA: Diagnosis not present

## 2022-01-05 DIAGNOSIS — Z1329 Encounter for screening for other suspected endocrine disorder: Secondary | ICD-10-CM | POA: Diagnosis not present

## 2022-01-05 DIAGNOSIS — K219 Gastro-esophageal reflux disease without esophagitis: Secondary | ICD-10-CM | POA: Diagnosis not present

## 2022-01-05 DIAGNOSIS — Z0001 Encounter for general adult medical examination with abnormal findings: Secondary | ICD-10-CM | POA: Diagnosis not present

## 2022-01-23 ENCOUNTER — Ambulatory Visit: Admit: 2022-01-23 | Discharge: 2022-01-23 | Payer: PRIVATE HEALTH INSURANCE

## 2022-01-23 DIAGNOSIS — B181 Chronic viral hepatitis B without delta-agent: Secondary | ICD-10-CM | POA: Diagnosis not present

## 2022-02-09 DIAGNOSIS — E782 Mixed hyperlipidemia: Secondary | ICD-10-CM | POA: Diagnosis not present

## 2022-02-09 DIAGNOSIS — I10 Essential (primary) hypertension: Secondary | ICD-10-CM | POA: Diagnosis not present

## 2022-02-09 DIAGNOSIS — R7303 Prediabetes: Secondary | ICD-10-CM | POA: Diagnosis not present

## 2022-07-07 DIAGNOSIS — R5381 Other malaise: Principal | ICD-10-CM

## 2022-07-07 DIAGNOSIS — B181 Chronic viral hepatitis B without delta-agent: Principal | ICD-10-CM

## 2022-07-07 DIAGNOSIS — Z1289 Encounter for screening for malignant neoplasm of other sites: Principal | ICD-10-CM

## 2022-07-07 DIAGNOSIS — R5383 Other fatigue: Principal | ICD-10-CM

## 2022-08-28 ENCOUNTER — Ambulatory Visit: Admit: 2022-08-28 | Discharge: 2022-08-28 | Payer: PRIVATE HEALTH INSURANCE

## 2022-08-28 DIAGNOSIS — R5383 Other fatigue: Secondary | ICD-10-CM | POA: Diagnosis not present

## 2022-08-28 DIAGNOSIS — R5381 Other malaise: Secondary | ICD-10-CM | POA: Diagnosis not present

## 2022-08-28 DIAGNOSIS — K7689 Other specified diseases of liver: Secondary | ICD-10-CM | POA: Diagnosis not present

## 2022-08-28 DIAGNOSIS — B181 Chronic viral hepatitis B without delta-agent: Secondary | ICD-10-CM | POA: Diagnosis not present

## 2022-08-28 DIAGNOSIS — R932 Abnormal findings on diagnostic imaging of liver and biliary tract: Secondary | ICD-10-CM | POA: Diagnosis not present

## 2022-08-28 DIAGNOSIS — Z1289 Encounter for screening for malignant neoplasm of other sites: Secondary | ICD-10-CM | POA: Diagnosis not present

## 2023-02-01 ENCOUNTER — Ambulatory Visit: Admit: 2023-02-01 | Discharge: 2023-02-02 | Payer: PRIVATE HEALTH INSURANCE

## 2023-02-01 DIAGNOSIS — B181 Chronic viral hepatitis B without delta-agent: Principal | ICD-10-CM

## 2023-02-01 NOTE — Unmapped (Addendum)
[facility]  02/01/2023    Reason for visit: Follow up for Chronic viral hepatitis B without delta agent and without coma (CMS-HCC) [B18.1]    Assessment/Plan:    42 y.o. male with 42 y.o. male with chronic HBV who presents for follow-up.     HBV: diagnosed in high school; +family history. From [place]. HBeAb+ with low DNA. Fibroscan 05/2020 with LSM 9kPa (F2) and S2 steatosis.  - Monitor HBV DNA and ALT q38months and HBsAg/sAb annually  - Will consider repeat Fibroscan at next clinic visit  - Addendum 02/09/23: AST/ALT elevated, and HBV DNA has risen to 3,600. Spoke to patient on the phone. Will start entecavir. (Looked into [facility] but too expensive).  HCC surveillance: q31month screening discussed.   - Next due August 2024 (ordered today, instructed pt to schedule).  - AFP ordered with blood work today  Health maintenance: HCV/HIV negative. HDV negative.     Return in about 6 months (around 08/04/2023).    Subjective   History of Present Illness   Accompanied by: N/A (unaccompanied)    42 y.o. male with chronic HBV who presents for follow-up.  Last visit: 06/09/2021    Objective   Physical Exam   Vital Signs: BP 115/85  - Pulse 83  - Temp 37 ??C (98.6 ??F) (Tympanic)  - Wt 99.9 kg (220 lb 3.2 oz)  - SpO2 100%  - BMI 32.52 kg/m??   Constitutional: He is in no apparent distress  Eyes: Anicteric sclerae  Cardiovascular: No peripheral edema  Gastrointestinal: Non-distended abdomen  Neurologic: Awake, alert, and oriented to person, place, and time with normal speech     Lab Results   Component Value Date    Total Bilirubin 2.2 (H) 08/28/2022    Total Bilirubin 1.0 07/13/2011    AST 26 08/28/2022    AST 20 07/13/2011    ALT 29 08/28/2022    ALT 21 07/13/2011    Alkaline Phosphatase 67 08/28/2022    Alkaline Phosphatase 51 07/13/2011

## 2023-02-09 MED ORDER — TENOFOVIR ALAFENAMIDE 25 MG TABLET
ORAL_TABLET | Freq: Every day | ORAL | 11 refills | 30 days | Status: CP
Start: 2023-02-09 — End: ?

## 2023-02-10 DIAGNOSIS — B181 Chronic viral hepatitis B without delta-agent: Principal | ICD-10-CM

## 2023-02-10 MED ORDER — ENTECAVIR 0.5 MG TABLET
ORAL_TABLET | Freq: Every day | ORAL | 3 refills | 90 days | Status: CP
Start: 2023-02-10 — End: 2024-02-10
  Filled 2023-02-11: qty 90, 90d supply, fill #0

## 2023-02-10 NOTE — Unmapped (Signed)
[facility]   Patient Onboarding/Medication Counseling    Ralph Watson is a 42 y.o. male with Hepatitis B who I am counseling today on initiation of therapy.  I am speaking to the patient.    Was a Nurse, learning disability used for this call? No    Verified patient's date of birth / HIPAA.    Specialty medication(s) to be sent: Infectious Disease: entecavir      Non-specialty medications/supplies to be sent: n/a      Medications not needed at this time: n/a         Entecavir  0.5mg  tablets    Medication & Administration     Dosage: Take one tablet by mouth daily    Administration: Take 1 tablet daily on an empty stomach (2 hours before or 2 hours after eating)    Adherence/Missed dose instructions: take missed dose as soon as you remember. If it is close to the time of your next dose, skip the dose and resume with your next scheduled dose.    Goals of Therapy     The goal is to keep HBV levels non-detectable on lab tests    Side Effects & Monitoring Parameters     Common Side Effects:     Headache  Dizziness  Feeling tired or weak  Upset stomach    The following side effects should be reported to the provider:    Signs of an allergic reaction, like rash; hives; itching; red, swollen, blistered, or peeling skin with or without fever; wheezing; tightness in the chest or throat; trouble breathing, swallowing, or talking; unusual hoarseness; or swelling of the mouth, face, lips, tongue, or throat.  Signs of liver problems like dark urine, feeling tired, not hungry, upset stomach or stomach pain, light-colored stools, throwing up, or yellow skin or eyes.  Signs of too much lactic acid in the blood (lactic acidosis) like fast breathing, fast heartbeat, a heartbeat that does not feel normal, very bad upset stomach or throwing up, feeling very sleepy, shortness of breath, feeling very tired or weak, very bad dizziness, feeling cold, or muscle pain or cramps    Monitoring Parameters:    HBV DNA (HBV DNA usually done every 3 months until undetectable and then every 3 to 6 months thereafter)  ALT   HBeAg  anti-HBe   HBsAg  Renal function at baseline and at least annually; monitor renal function more frequently in patients at high risk of renal dysfunction           Contraindications, Warnings, & Precautions     Lactic acidosis/hepatomegaly: [US Boxed Warning]: Lactic acidosis and severe hepatomegaly with steatosis (including fatal cases) have been reported with nucleoside analogue inhibitors; use with caution in patients with risk factors for liver disease (risk may be increased with male gender, decompensated liver disease, obesity, or prolonged nucleoside inhibitor exposure) and suspend treatment in any patient who develops clinical or laboratory findings suggestive of lactic acidosis or hepatotoxicity (transaminase elevation may/may not accompany hepatomegaly and steatosis).  HIV: [US Boxed Warning]: May cause the development of HIV resistance in chronic hepatitis B patients with unrecognized or untreated HIV infection. Determine HIV status prior to initiating treatment with entecavir. Not recommended for HIV/HBV coinfected patients unless also receiving antiretroviral therapy. The manufacturer's labeling states that entecavir does not exhibit any clinically-relevant activity against human immunodeficiency virus (HIV type 1). However, a small number of case reports have indicated declines in virus levels during entecavir therapy. HIV resistance to a common  HIV drug has been reported in an HIV/HBV-infected patient receiving entecavir as monotherapy for HBV.      Drug/Food Interactions     Medication list reviewed in Epic. The patient was instructed to inform the care team before taking any new medications or supplements. No drug interactions identified.   Food decreases absorption. Must be taken on empty stomach      Storage, Handling Precautions, & Disposal     Store at room temperature in a dry place. Do not store in a bathroom.  Keep lid tightly closed.  Store in the original container to protect from light.  Keep all drugs in a safe place. Keep all drugs out of the reach of children and pets.  Throw away unused or expired drugs. Do not flush down a toilet or pour down a drain unless you are told to do so. Check with your pharmacist if you have questions about the best way to throw out drugs. There may be drug take-back programs in your area.      Current Medications (including OTC/herbals), Comorbidities and Allergies     Current Outpatient Medications   Medication Sig Dispense Refill    amLODIPine (NORVASC) 10 MG tablet Take 1 tablet (10 mg total) by mouth daily.      aspirin (ECOTRIN) 81 MG tablet Take 1 tablet (81 mg total) by mouth daily.      atorvastatin (LIPITOR) 40 MG tablet Take 1 tablet (40 mg total) by mouth.      cholecalciferol, vitamin D3, (VITAMIN D3 ORAL) Take 1,000 Units by mouth every morning before breakfast.      entecavir (BARACLUDE) 0.5 MG tablet Take 1 tablet (0.5 mg total) by mouth daily. 90 tablet 3     No current facility-administered medications for this visit.       No Known Allergies    Patient Active Problem List   Diagnosis    Chronic viral hepatitis B without delta agent and without coma (CMS-HCC)    Malaise and fatigue    Hypertension    Esophageal reflux       Reviewed and up to date in Epic.      HEPATITIS B AND ASSOCIATED LABS:       Lab Results   Component Value Date/Time    HBVDNAQT Detected (A) 02/01/2023 04:06 PM    HBVDNAQT Detected (A) 08/28/2022 10:42 AM    HBVDNAQT Detected (A) 06/09/2021 10:03 AM    HBVDNACP 3,611 (H) 02/01/2023 04:06 PM    HBVDNACP 185 (H) 08/28/2022 10:42 AM    HBVDNACP 877 (H) 06/09/2021 10:03 AM    HBVD10 3.56 (H) 02/01/2023 04:06 PM    HBVD10 2.27 (H) 08/28/2022 10:42 AM    HBVD10 2.94 (H) 06/09/2021 10:03 AM    HBEAG Negative 08/28/2022 10:42 AM    HBEAG Negative 11/26/2020 10:12 AM    HBEAG Negative 05/28/2020 09:58 AM    HBSAG (A) 08/28/2022 10:42 AM     For final results see Hepatitis B Surface Antigen Neutralization    HBSAG (A) 11/26/2020 10:12 AM     For final results see Hepatitis B Surface Antigen Neutralization    HBSAG (A) 05/28/2020 09:58 AM     For final results see Hepatitis B Surface Antigen Neutralization    HEPBSAB Nonreactive 05/28/2020 09:58 AM    HEPBCAB Reactive (A) 05/28/2020 09:58 AM    ALT 45 02/01/2023 04:06 PM    ALT 29 08/28/2022 10:42 AM    ALT 40 06/09/2021 10:03 AM  ALT 21 07/13/2011 04:07 PM    ALT 22 02/13/2011 10:49 AM    ALT 26 01/26/2011 02:41 PM    AST 57 (H) 02/01/2023 04:06 PM    AST 20 07/13/2011 04:07 PM    CREATININE 1.14 08/28/2022 10:42 AM    CREATININE 1.08 11/26/2020 10:12 AM    CREATININE 1.06 05/28/2020 09:58 AM    CREATININE 1.3 (H) 07/13/2011 04:07 PM    CREATININE 1.2 02/13/2011 10:49 AM    CREATININE 1.2 01/26/2011 02:41 PM       Appropriateness of Therapy     Acute infections noted within Epic:  No active infections  Patient reported infection: None    Is medication and dose appropriate based on diagnosis and infection status? Yes    Prescription has been clinically reviewed: Yes      Baseline Quality of Life Assessment      How many days over the past month did your Hepatitis B  keep you from your normal activities? For example, brushing your teeth or getting up in the morning. 0    Financial Information     Medication Assistance provided: Copay Assistance/Discount card    Anticipated copay of $21.27 for 30 day supply or $53.40 for a 90 day supply was reviewed with patient. Verified delivery address.    Delivery Information     Scheduled delivery date: 02/12/23    Expected start date: 02/12/23      Medication will be delivered via UPS to the prescription address in [facility].  This shipment will not require a signature.      Explained the services we provide at [facility] and that each month we would call to set up refills.  Stressed importance of returning phone calls so that we could ensure they receive their medications in time each month.  Informed patient that we should be setting up refills 7-10 days prior to when they will run out of medication.  A pharmacist will reach out to perform a clinical assessment periodically.  Informed patient that a welcome packet, containing information about our pharmacy and other support services, a Notice of Privacy Practices, and a drug information handout will be sent.      The patient or caregiver noted above participated in the development of this care plan and knows that they can request review of or adjustments to the care plan at any time.      Patient or caregiver verbalized understanding of the above information as well as how to contact the pharmacy at [phone] option 4 with any questions/concerns.  The pharmacy is open Monday through Friday 8:30am-4:30pm.  A pharmacist is available 24/7 via pager to answer any clinical questions they may have.    Patient Specific Needs     Does the patient have any physical, cognitive, or cultural barriers? No    Does the patient have adequate living arrangements? (i.e. the ability to store and take their medication appropriately) Yes    Did you identify any home environmental safety or security hazards? No    Patient prefers to have medications discussed with  Patient     Is the patient or caregiver able to read and understand education materials at a high school level or above? Yes    Patient's primary language is  English     Is the patient high risk? No    SOCIAL DETERMINANTS OF HEALTH     At the [facility], we have learned that life  circumstances - like trouble affording food, housing, utilities, or transportation can affect the health of many of our patients.   That is why we wanted to ask: are you currently experiencing any life circumstances that are negatively impacting your health and/or quality of life? Patient declined to answer    Social Determinants of Health     Financial Resource Strain: Not on file   Internet Connectivity: Not on file   Food Insecurity: Not on file   Tobacco Use: Low Risk  (06/09/2021)    Patient History     Smoking Tobacco Use: Never     Smokeless Tobacco Use: Never     Passive Exposure: Not on file   Housing/Utilities: Not on file   Alcohol Use: Not on file   Transportation Needs: Not on file   Substance Use: Not on file   Health Literacy: Not on file   Physical Activity: Not on file   Interpersonal Safety: Unknown (02/10/2023)    Interpersonal Safety     Unsafe Where You Currently Live: Not on file     Physically Hurt by Anyone: Not on file     Abused by Anyone: Not on file   Stress: Not on file   Intimate Partner Violence: Not on file   Depression: Not on file   Social Connections: Not on file       Would you be willing to receive help with any of the needs that you have identified today? Not applicable       Roderic Palau, PharmD  [facility] Specialty Pharmacist

## 2023-02-10 NOTE — Unmapped (Signed)
Hepatology Clinic Pharmacist Note    Dx - Chronic Hep B B18.1  Planned regimen: entecavir 0.5mg  daily on empty    Insurance: [employer] form not obtained at this time.    Ralph Watson is 42 y.o. who presented to clinic on 02/01/23 and saw Dr. Octaviano Glow. Reviewed rationale for HBV treatment. Also reviewed outcome of benefit investigation. Although [employer] did not require PA, pt's copay was $1380.57 and insurance covered $0. Copay card available but would only be enough for 4 months. Entecavir copay with insurance was $177 (insurance again covered $0). Reviewed will use a discount card instead which would cost $21.27/month. Pt reports this would be affordable. 3 month supply = $52.40    Reviewed administration and potential adverse effects of entecavir. Stressed importance of adherence of HBV treatment medication to avoid flare up of HBV and follow up monitoring. Reviewed medication will be delivered to his home.      Park Breed, Pharm D., BCPS, BCGP, CPP  [facility]  [address]  [place], [place] [address]  [phone]

## 2023-02-10 NOTE — Unmapped (Signed)
Addended byMickie Hillier on: 02/10/2023 01:40 PM     Modules accepted: Orders

## 2023-02-15 NOTE — Unmapped (Signed)
[facility] Clinical Intervention    Type of intervention: Patient education    Medication involved: entecavir    Problem identified: Patient called to ask about taking an empty stomach.  He is doing intermittent fasting and wanted to know if he had to eat 2 hours after taking entecavir.  Also, he wanted to know if he can take a cinnamon supplement and apple cider vinegar and lemon mixture.    Intervention performed:   I told him it is best to not take a cinnamon supplement since some cinnamon supplements contains coumarin which is toxic to the liver.  I said an occasional sprinkling of cinnamon to flavor foods is ok.    I told him that he does not have to eat 2 hours after taking the entecavir and that the most important thing is that he takes it at least 2 hours before or 2 hours after eating and taking during his fasting period is fine.  I told him that taking the apple cider vinegar and lemon mixed in water near bedtime is no problem    Follow-up needed: no    Approximate time spent: 15-20 minutes    Clinical evidence used to support intervention:  LiverTox     Result of the intervention:  prevented patient from taking a potential liver toxin    Roderic Palau, PharmD   [facility] Specialty Pharmacist

## 2023-03-13 NOTE — Unmapped (Signed)
[facility] Clinical Assessment & Refill Coordination Note    Ralph Watson, DOB: [birth year]  Phone: [phone] (home)     All above HIPAA information was verified with patient.     Was a Nurse, learning disability used for this call? No    Specialty Medication(s):   Infectious Disease: entecavir     Current Outpatient Medications   Medication Sig Dispense Refill    amLODIPine (NORVASC) 10 MG tablet Take 1 tablet (10 mg total) by mouth daily.      aspirin (ECOTRIN) 81 MG tablet Take 1 tablet (81 mg total) by mouth daily.      atorvastatin (LIPITOR) 40 MG tablet Take 1 tablet (40 mg total) by mouth.      cholecalciferol, vitamin D3, (VITAMIN D3 ORAL) Take 1,000 Units by mouth every morning before breakfast.      entecavir (BARACLUDE) 0.5 MG tablet Take 1 tablet (0.5 mg total) by mouth daily. 90 tablet 3     No current facility-administered medications for this visit.        Changes to medications: [name] reports no changes at this time.    No Known Allergies    Changes to allergies: No    SPECIALTY MEDICATION ADHERENCE     entecavir 0.5 mg: approixmately 60 days of medicine on hand     Medication Adherence    Patient reported X missed doses in the last month: 0  Specialty Medication: entecavir 0.5mg   Patient is on additional specialty medications: No  Demonstrates understanding of importance of adherence: yes  Informant: patient  Provider-estimated medication adherence level: good  Patient is at risk for Non-Adherence: No          Specialty medication(s) dose(s) confirmed: Regimen is correct and unchanged.     Are there any concerns with adherence? No    Adherence counseling provided? Not needed    CLINICAL MANAGEMENT AND INTERVENTION      Clinical Benefit Assessment:    Do you feel the medicine is effective or helping your condition? Yes    Clinical Benefit counseling provided? Not needed    Adverse Effects Assessment:    Are you experiencing any side effects? Yes, patient reports experiencing intermittent headache and fatigue. Side effect counseling provided: I recommended he stay well hydrated to minimized these side effects.   He also reported having some dizziness and heart palpitations in the beginning that have now stopped.  I do not find these symptoms to be associated with entecavir.    Are you experiencing difficulty administering your medicine? No    Quality of Life Assessment:    Quality of Life    Rheumatology  Oncology  Dermatology  Cystic Fibrosis          How many days over the past month did your Hepatitis B  keep you from your normal activities? For example, brushing your teeth or getting up in the morning. 0    Have you discussed this with your provider? Not needed    Acute Infection Status:    Acute infections noted within Epic:  No active infections  Patient reported infection: None    Therapy Appropriateness:    Is therapy appropriate based on current medication list, adverse reactions, adherence, clinical benefit and progress toward achieving therapeutic goals? Yes, therapy is appropriate and should be continued     DISEASE/MEDICATION-SPECIFIC INFORMATION      N/A    Hepatitis B: Not Applicable    PATIENT SPECIFIC NEEDS  Does the patient have any physical, cognitive, or cultural barriers? No    Is the patient high risk? No    Did the patient require a clinical intervention? No    Does the patient require physician intervention or other additional services (i.e., nutrition, smoking cessation, social work)? No    SOCIAL DETERMINANTS OF HEALTH     At the [facility], we have learned that life circumstances - like trouble affording food, housing, utilities, or transportation can affect the health of many of our patients.   That is why we wanted to ask: are you currently experiencing any life circumstances that are negatively impacting your health and/or quality of life? Patient declined to answer    Social Determinants of Health     Financial Resource Strain: Not on file Internet Connectivity: Not on file   Food Insecurity: Not on file   Tobacco Use: Low Risk  (06/09/2021)    Patient History     Smoking Tobacco Use: Never     Smokeless Tobacco Use: Never     Passive Exposure: Not on file   Housing/Utilities: Not on file   Alcohol Use: Not on file   Transportation Needs: Not on file   Substance Use: Not on file   Health Literacy: Not on file   Physical Activity: Not on file   Interpersonal Safety: Unknown (03/12/2023)    Interpersonal Safety     Unsafe Where You Currently Live: Not on file     Physically Hurt by Anyone: Not on file     Abused by Anyone: Not on file   Stress: Not on file   Intimate Partner Violence: Not on file   Depression: Not on file   Social Connections: Not on file       Would you be willing to receive help with any of the needs that you have identified today? Not applicable       SHIPPING     Specialty Medication(s) to be Shipped:   Infectious Disease: He does not need a refill of entecavir at this time    Other medication(s) to be shipped: No additional medications requested for fill at this time     Changes to insurance: No    Delivery Scheduled:  No      The patient will receive a drug information handout for each medication shipped and additional FDA Medication Guides as required.  Verified that patient has previously received a Conservation officer, historic buildings and a Surveyor, mining.    The patient or caregiver noted above participated in the development of this care plan and knows that they can request review of or adjustments to the care plan at any time.      All of the patient's questions and concerns have been addressed.    Roderic Palau, PharmD   [facility] Specialty Pharmacist

## 2023-05-06 NOTE — Unmapped (Signed)
[facility] Specialty and Home Delivery Pharmacy Refill Coordination Note    Specialty Medication(s) to be Shipped:   Infectious Disease: entecavir    Other medication(s) to be shipped: No additional medications requested for fill at this time     Ralph Watson, DOB: 1980-08-07  Phone: [phone] (home)       All above HIPAA information was verified with patient.     Was a Nurse, learning disability used for this call? No    Completed refill call assessment today to schedule patient[facility]  [phone]).  All relevant notes have been reviewed.     Specialty medication(s) and dose(s) confirmed: Regimen is correct and unchanged.   Changes to medications: Moua reports no changes at this time.  Changes to insurance: No  New side effects reported not previously addressed with a pharmacist or physician: None reported  Questions for the pharmacist: No    Confirmed patient received a Conservation officer, historic buildings and a Surveyor, mining with first shipment. The patient will receive a drug information handout for each medication shipped and additional FDA Medication Guides as required.       DISEASE/MEDICATION-SPECIFIC INFORMATION        N/A    SPECIALTY MEDICATION ADHERENCE     Medication Adherence    Patient reported X missed doses in the last month: 0  Specialty Medication: entecavir 0.5 MG tablet (BARACLUDE)  Patient is on additional specialty medications: No              Were doses missed due to medication being on hold? No    entecavir 0.5 mg: 4 days of medicine on hand       REFERRAL TO PHARMACIST     Referral to the pharmacist: Not needed      [facility]     Shipping address confirmed in Epic.       Delivery Scheduled: Yes, Expected medication delivery date: 05/08/23.     Medication will be delivered via UPS to the prescription address in Epic WAM.    Ralph Watson   [facility] Specialty and Home Delivery Pharmacy  Specialty Technician

## 2023-05-07 MED FILL — ENTECAVIR 0.5 MG TABLET: ORAL | 90 days supply | Qty: 90 | Fill #1

## 2023-05-20 NOTE — Unmapped (Signed)
[facility] Clinical Intervention    Type of intervention: Patient education    Medication involved: entecavir    Problem identified: Patient called to ask if its ok to take a multivitamin with entecavir and asked about a discomfort in his upper abdomen (both sides) that starts about 30 min after he takes the med in the am and it comes off and on during the day.    Intervention performed: I told him that there is no interaction with entecavir and multivitamins with minerals.  For the discomfort, I suggested he try taking the medication at bedtime starting tomorrow.  Also,  I told him that if the pain persists to let the liver clinic know as soon as possible.    Follow-up needed: Mr. Hornaday will monitor his symptoms of abdominal discomfort and call the liver clinic if no improvement after changing administration to bedtime.    Approximate time spent: 10-15 minutes    Clinical evidence used to support intervention:  [employer] drug Scientist, research (physical sciences) and consultation with the clinical pharmacist, Park Breed, CPP regarding his stomach discomfort    Result of the intervention:  helped patient understand about possible drug interaction and hopefully gave him a strategy to help with adverse effects from his medication.    Roderic Palau, PharmD   [facility] Special[facility] and Home Delivery Pharmacy Specialty Pharmacist

## 2023-07-29 NOTE — Unmapped (Signed)
[facility] Special[facility] and Home Delivery Pharmacy Refill Coordination Note    Specialty Medication(s) to be Shipped:   Infectious Disease: entecavir    Other medication(s) to be shipped: No additional medications requested for fill at this time     Ralph Watson, DOB: [birth year]  Phone: [phone] (home)       All above HIPAA information was verified with patient.     Was a Nurse, learning disability used for this call? No    Completed refill call assessment today to schedule patient's medication shipment from the [facility]  [phone]).  All relevant notes have been reviewed.     Specialty medication(s) and dose(s) confirmed: Regimen is correct and unchanged.   Changes to medications: Latravius reports no changes at this time.  Changes to insurance: No  New side effects reported not previously addressed with a pharmacist or physician: None reported  Questions for the pharmacist: No    Confirmed patient received a Conservation officer, historic buildings and a Surveyor, mining with first shipment. The patient will receive a drug information handout for each medication shipped and additional FDA Medication Guides as required.       DISEASE/MEDICATION-SPECIFIC INFORMATION        N/A    SPECIALTY MEDICATION ADHERENCE     Medication Adherence    Patient reported X missed doses in the last month: 0  Specialty Medication: entecavir 0.5 MG tablet (BARACLUDE)  Patient is on additional specialty medications: No  Patient is on more than two specialty medications: No  Any gaps in refill history greater than 2 weeks in the last 3 months: no  Demonstrates understanding of importance of adherence: yes  Informant: patient  Confirmed plan for next specialty medication refill: delivery by pharmacy  Refills needed for supportive medications: not needed          Refill Coordination    Has the Patients' Contact Information Changed: No  Is the Shipping Address Different: No         Were doses missed due to medication being on hold? No    entecavir 0.5  mg: 10 days of medicine on hand       REFERRAL TO PHARMACIST     Referral to the pharmacist: Not needed      [facility]     Shipping address confirmed in Epic.       Delivery Scheduled: Yes, Expected medication delivery date: 08/04/23.     Medication will be delivered via UPS to the prescription address in Epic WAM.    Kerby Less   [facility] Special[facility] and [facility] Delivery Pharmacy  Specialty Technician

## 2023-08-03 MED FILL — ENTECAVIR 0.5 MG TABLET: ORAL | 90 days supply | Qty: 90 | Fill #2

## 2023-08-10 ENCOUNTER — Ambulatory Visit: Admit: 2023-08-10 | Discharge: 2023-08-10 | Payer: PRIVATE HEALTH INSURANCE

## 2023-08-10 DIAGNOSIS — B181 Chronic viral hepatitis B without delta-agent: Principal | ICD-10-CM

## 2023-08-10 DIAGNOSIS — E669 Obesity, unspecified: Principal | ICD-10-CM

## 2023-08-10 LAB — BILIRUBIN, DIRECT: BILIRUBIN DIRECT: 0.6 mg/dL — ABNORMAL HIGH (ref 0.00–0.30)

## 2023-08-10 LAB — COMPREHENSIVE METABOLIC PANEL
ALBUMIN: 4.3 g/dL (ref 3.4–5.0)
ALKALINE PHOSPHATASE: 85 U/L (ref 46–116)
ALT (SGPT): 37 U/L (ref 10–49)
ANION GAP: 9 mmol/L (ref 5–14)
AST (SGOT): 34 U/L (ref <=34)
BILIRUBIN TOTAL: 2.3 mg/dL — ABNORMAL HIGH (ref 0.3–1.2)
BLOOD UREA NITROGEN: 15 mg/dL (ref 9–23)
BUN / CREAT RATIO: 13
CALCIUM: 9.7 mg/dL (ref 8.7–10.4)
CHLORIDE: 101 mmol/L (ref 98–107)
CO2: 30.8 mmol/L (ref 20.0–31.0)
CREATININE: 1.2 mg/dL — ABNORMAL HIGH (ref 0.73–1.18)
EGFR CKD-EPI (2021) MALE: 77 mL/min/{1.73_m2} (ref >=60)
GLUCOSE RANDOM: 88 mg/dL (ref 70–179)
POTASSIUM: 4.2 mmol/L (ref 3.4–4.8)
PROTEIN TOTAL: 8.2 g/dL (ref 5.7–8.2)
SODIUM: 141 mmol/L (ref 135–145)

## 2023-08-10 LAB — AFP TUMOR MARKER: AFP-TUMOR MARKER: 9 ng/mL — ABNORMAL HIGH (ref <=8)

## 2023-08-10 LAB — CBC
HEMATOCRIT: 45.8 % (ref 39.0–48.0)
HEMOGLOBIN: 15.5 g/dL (ref 12.9–16.5)
MEAN CORPUSCULAR HEMOGLOBIN CONC: 33.9 g/dL (ref 32.0–36.0)
MEAN CORPUSCULAR HEMOGLOBIN: 28.6 pg (ref 25.9–32.4)
MEAN CORPUSCULAR VOLUME: 84.5 fL (ref 77.6–95.7)
MEAN PLATELET VOLUME: 9 fL (ref 6.8–10.7)
PLATELET COUNT: 194 10*9/L (ref 150–450)
RED BLOOD CELL COUNT: 5.41 10*12/L (ref 4.26–5.60)
RED CELL DISTRIBUTION WIDTH: 14.4 % (ref 12.2–15.2)
WBC ADJUSTED: 4 10*9/L (ref 3.6–11.2)

## 2023-08-10 NOTE — Unmapped (Unsigned)
[facility]  08/10/2023    Reason for visit: Follow up for Chronic viral hepatitis B without delta agent and without coma (CMS-HCC) [B18.1]    Assessment/Plan:    43 y.o. male with chronic HBV who presents for follow-up.     HBV: diagnosed in high school; +family history. From [place]. HBeAb+ with low DNA. Started entecavir 02/09/2023 d/t AST/ALT elevation, and HBV DNA had risen to 3,600. (Looked into [place] but too expensive). Fibroscan 05/2020 with LSM 9kPa (F2) and S2 steatosis.   - Monitor HBV DNA and ALT q33months and HBsAg/sAb annually  - Will consider repeat Fibroscan at some point   - recommended avoiding HDS  HCC surveillance: q64month screening discussed. Overdue (missed last [place] in 02/2023)   - [place] ordered for [facility] in [place] closer to [place] he resides  - AFP ordered with blood work today  Elevated bilirubin: noted on last labs  to be elevated (total bili 3, direct bili 1) mostly indirect and likely Gilbert's  --UGT1a1 to confirm  Risk for metabolic liver disease: pt has htn, hld, and obesity. Is concerned about weight gain (BMI 35)   --discussed healthy diet / exercise   --referral to nutrition (virtual visit preferred)     Health maintenance: HCV/HIV negative. HDV negative.     Return in about 6 months (around 02/07/2024).        Reeves Forth. Nancee Brownrigg, ANP- [facility]  [address]  [place] [address]  [phone]     Subjective   History of Present Illness   Accompanied by: N/A (unaccompanied)    43 y.o. male with chronic HBV who presents for follow-up.  Last visit: 01/2023  --doing well   --no liver related concerns  --taking entecavir 0.5 mg daily (gets from [facility]). 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   Constitutional: He is in no apparent distress  Eyes: Anicteric sclerae  Cardiovascular: No peripheral edema  Gastrointestinal: Non-distended abdomen  Neurologic: Awake, alert, and oriented to person, place, and time with normal speech     Lab Results   Component Value Date    Total Bilirubin 3.1 (H) 02/01/2023    Total Bilirubin 1.0 07/13/2011    AST 57 (H) 02/01/2023    AST 20 07/13/2011    ALT 45 02/01/2023    ALT 21 07/13/2011    Alkaline Phosphatase 92 02/01/2023    Alkaline Phosphatase 51 07/13/2011     Studies:   US abdomen 08/28/2022: mildly echogenic, mildly nodular, no FLL. Vis A score

## 2023-08-10 NOTE — Unmapped (Addendum)
--  great to see you   --labs today  --ultrasound ordered for [facility] in [place] (closer) [phone]  may be the number to call and schedule   --nutrition referral   --take entecavir every day, reach out with any concerns      Call  my nurse Lanora Manis with any concerns or questions  [phone]    Or message me in [place]       -    ;

## 2023-08-11 LAB — HEPATITIS B SURFACE ANTIGEN

## 2023-08-11 LAB — HEPATITIS B SURFACE ANTIBODY
HEPATITIS B SURFACE ANTIBODY QUANT: <8 m[IU]/mL (ref <8.00)
HEPATITIS B SURFACE ANTIBODY: NONREACTIVE

## 2023-08-11 LAB — HEPATITIS B DNA, QUANTITATIVE, PCR
HBV DNA QUANT: DETECTED — AB
HBV DNA: <10 [IU]/mL — ABNORMAL HIGH (ref <=0)

## 2023-08-11 LAB — HEP.B NEUTRALIZATION: HBSAG NEUT: POSITIVE — AB

## 2023-08-13 LAB — PHOSPHATIDYLETHANOL (PETH)
PETH 16:0/18:1 (POPETH) BY LC-MS/MS: <10 ng/mL
PETH 16:0/18:2 (PLPETH) BY LC-MS/MS: <10 ng/mL
PETH INTERPRETATION: NEGATIVE

## 2023-08-18 ENCOUNTER — Inpatient Hospital Stay: Admit: 2023-08-18 | Discharge: 2023-08-19 | Payer: PRIVATE HEALTH INSURANCE

## 2023-08-18 DIAGNOSIS — B181 Chronic viral hepatitis B without delta-agent: Principal | ICD-10-CM

## 2023-09-02 NOTE — Unmapped (Signed)
Call from Elio Forget, Senior Epidemiologist with the [facility] Department--they had received a partial positive Hep B surface antigen on 1/14 and asked if they were any other positives to confirm this result--> pt had a Hep B neutralization done to confirm the positive.  Pt is being treated for chronic Hep B.    Mr. Ralph Watson stated that he will send a release form for medical records. Fax number provided.    Lanora Manis [employer]

## 2023-10-21 NOTE — Unmapped (Signed)
 [facility] Specialty and Home Delivery Pharmacy Refill Coordination Note    Specialty Medication(s) to be Shipped:   Infectious Disease: entecavir    Other medication(s) to be shipped: No additional medications requested for fill at this time     Ralph Watson, DOB: 06-28-1981  Phone: [phone] (home)       All above HIPAA information was verified with patient.     Was a Nurse, learning disability used for this call? No    Completed refill call assessment today to schedule patient's medication shipment from the [facility]  [phone]).  All relevant notes have been reviewed.     Specialty medication(s) and dose(s) confirmed: Regimen is correct and unchanged.   Changes to medications: Ralph Watson reports no changes at this time.  Changes to insurance: No  New side effects reported not previously addressed with a pharmacist or physician: None reported  Questions for the pharmacist: Yes: it vit-B complex and potassium okay to take with medication    Confirmed patient received a Conservation officer, historic buildings and a Surveyor, mining with first shipment. The patient will receive a drug information handout for each medication shipped and additional FDA Medication Guides as required.       DISEASE/MEDICATION-SPECIFIC INFORMATION        N/A    SPECIALTY MEDICATION ADHERENCE     Medication Adherence    Patient reported X missed doses in the last month: 0  Specialty Medication: entecavir 0.5 MG tablet (BARACLUDE)  Patient is on additional specialty medications: No              Were doses missed due to medication being on hold? No    entecavir 0.5 MG tablet (BARACLUDE): 11 days of medicine on hand       REFERRAL TO PHARMACIST     Referral to the pharmacist: Not needed      [facility]     Shipping address confirmed in Epic.     Cost and Payment: Patient has a copay of $53.40. They are aware and have authorized the pharmacy to charge the credit card on file.    Delivery Scheduled: Yes, Expected medication delivery date: 10/29/2023.     Medication will be delivered via UPS to the prescription address in Epic WAM.    Ralph Watson   [facility] Specialty and Home Delivery Pharmacy  Specialty Technician

## 2023-10-28 MED FILL — ENTECAVIR 0.5 MG TABLET: ORAL | 90 days supply | Qty: 90 | Fill #3

## 2024-01-18 ENCOUNTER — Ambulatory Visit: Admit: 2024-01-18 | Discharge: 2024-01-19 | Payer: PRIVATE HEALTH INSURANCE

## 2024-01-18 ENCOUNTER — Encounter: Admit: 2024-01-18 | Discharge: 2024-01-19 | Payer: PRIVATE HEALTH INSURANCE

## 2024-01-18 DIAGNOSIS — B181 Chronic viral hepatitis B without delta-agent: Principal | ICD-10-CM

## 2024-01-18 DIAGNOSIS — E669 Obesity, unspecified: Principal | ICD-10-CM

## 2024-01-18 LAB — AFP TUMOR MARKER: AFP-TUMOR MARKER: 10 ng/mL — ABNORMAL HIGH (ref <=8)

## 2024-01-18 LAB — HEPATIC FUNCTION PANEL
ALBUMIN: 4.3 g/dL (ref 3.4–5.0)
ALKALINE PHOSPHATASE: 68 U/L (ref 46–116)
ALT (SGPT): 32 U/L (ref 10–49)
AST (SGOT): 29 U/L (ref <=34)
BILIRUBIN DIRECT: 0.5 mg/dL — ABNORMAL HIGH (ref 0.00–0.30)
BILIRUBIN TOTAL: 1.9 mg/dL — ABNORMAL HIGH (ref 0.3–1.2)
PROTEIN TOTAL: 7.7 g/dL (ref 5.7–8.2)

## 2024-01-18 MED ORDER — ENTECAVIR 0.5 MG TABLET
ORAL_TABLET | Freq: Every day | ORAL | 3 refills | 90.00000 days | Status: CP
Start: 2024-01-18 — End: 2024-01-18
  Filled 2024-01-24: qty 90, 90d supply, fill #0

## 2024-01-18 NOTE — Unmapped (Signed)
 [facility]  01/18/2024    Reason for visit: Follow up for Chronic viral hepatitis B without delta agent and without coma   [B18.1]    Assessment/Plan:    43 y.o. male with chronic HBV who presents for follow-up.     HBV: diagnosed in high school; +family history. From [place]. HBeAb+ with low DNA. Started entecavir  02/09/2023. Fibroscan 05/2020 with LSM 9kPa (F2) and S2 steatosis.   - Continue entecavir  0.5mg  daily  - Monitor HBV DNA and ALT q76months today  - Check HBsAg/sAb annually (at next visit)  Metabolic syndrome: obesity, HTN, HLD. We have discussed weight gain in the past. He has lost 20 lbs since our last visit through dietary changes.  - Encouraged him to continue good work  - We had previously referred him to dietician but he was unable to make an appt fit into his work schedule. Will continue to monitor.  HCC surveillance: q55month screening discussed.   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  Health maintenance:   Colon cancer screening: due age 47    Return in about 6 months (around 07/19/2024).      Subjective   History of Present Illness   Accompanied by: N/A (unaccompanied)    43 y.o. male with chronic HBV who presents for follow-up.  Last visit: 08/16/23  History of Present Illness  He has been experiencing back and joint pain for approximately one week. The pain has been decreasing in intensity over time, and he is uncertain if it is related to an infection. He notes that the pain is improving.    He is currently taking entecavir  without any issues and confirms adherence to his medication regimen. He receives his prescriptions from the [facility] pharmacy. An ultrasound was performed in January, and a follow-up ultrasound is scheduled for next month.    He has been actively working on weight loss, successfully losing 20 pounds since his last visit, reducing his weight from 240 pounds to 220 pounds. He attributes this weight loss to intermittent fasting and reducing carbohydrate intake.    He monitors his blood pressure at home and notes that it is sometimes elevated during clinic visits, which he attributes to anxiety.        Objective   Physical Exam   Vital Signs: BP 161/91  - Pulse 91  - Temp 36.6 ??C (97.8 ??F) (Temporal)  - Ht 175.3 cm (5' 9)  - Wt 100 kg (220 lb 6.4 oz)  - SpO2 99%  - BMI 32.55 kg/m??   Constitutional: He is in no apparent distress  Eyes: Anicteric sclerae  Cardiovascular: No peripheral edema  Gastrointestinal: Non-distended abdomen  Neurologic: Awake, alert, and oriented to person, place, and time with normal speech     Data:  Lab Results   Component Value Date    Total Bilirubin 2.3 (H) 08/10/2023    Total Bilirubin 1.0 07/13/2011    AST 34 08/10/2023    AST 20 07/13/2011    ALT 37 08/10/2023    ALT 21 07/13/2011    Alkaline Phosphatase 85 08/10/2023    Alkaline Phosphatase 51 07/13/2011     US  abdomen 08/18/23: no lesions

## 2024-01-19 LAB — HEPATITIS B DNA, QUANTITATIVE, PCR: HBV DNA QUANT: NOT DETECTED

## 2024-01-19 NOTE — Unmapped (Signed)
 [facility] Clinical Assessment & Refill Coordination Note    Ralph Watson, DOB: [birth year]  Phone: [phone] (home)     All above HIPAA information was verified with patient.     Was a Nurse, learning disability used for this call? No    Specialty Medication(s):   Infectious Disease: entecavir      Current Medications[1]     Changes to medications: Crystal reports starting the following medications: multivitamin    Medication list has been reviewed and updated in Epic: Yes    Allergies[2]    Changes to allergies: No    Allergies have been reviewed and updated in Epic: Yes    SPECIALTY MEDICATION ADHERENCE     entecavir  0.5 mg: unsure of exact doses of medicine on hand       Medication Adherence    Patient reported X missed doses in the last month: 0  Specialty Medication: entecavir  0.5mg   Patient is on additional specialty medications: No  Informant: patient          Specialty medication(s) dose(s) confirmed: Regimen is correct and unchanged.     Are there any concerns with adherence? No    Adherence counseling provided? Not needed    CLINICAL MANAGEMENT AND INTERVENTION      Clinical Benefit Assessment:    Do you feel the medicine is effective or helping your condition? Patient declined to answer    Clinical Benefit counseling provided? Labs from 12/2023 show evidence of clinical benefit    Adverse Effects Assessment:    Are you experiencing any side effects? No    Are you experiencing difficulty administering your medicine? No    Quality of Life Assessment:    Quality of Life    Rheumatology  Oncology  Dermatology  Cystic Fibrosis          How many days over the past month did your HBV  keep you from your normal activities? For example, brushing your teeth or getting up in the morning. Patient declined to answer    Have you discussed this with your provider? Not needed    Acute Infection Status:    Acute infections noted within Epic:  No active infections    Patient reported infection: None    Therapy Appropriateness:    Is therapy appropriate based on current medication list, adverse reactions, adherence, clinical benefit and progress toward achieving therapeutic goals? Yes, therapy is appropriate and should be continued     Clinical Intervention:    Was an intervention completed as part of this clinical assessment? No    DISEASE/MEDICATION-SPECIFIC INFORMATION      N/A    Hepatitis B: Not Applicable      HEPATITIS B AND ASSOCIATED LABS:       Lab Results   Component Value Date/Time    HBVDNAQT Not Detected 01/18/2024 04:06 PM    HBVDNAQT Detected (A) 08/10/2023 01:35 PM    HBVDNAQT Detected (A) 02/01/2023 04:06 PM    HBVDNACP <10 (H) 08/10/2023 01:35 PM    HBVDNACP 3,611 (H) 02/01/2023 04:06 PM    HBVDNACP 185 (H) 08/28/2022 10:42 AM    HBVD10  08/10/2023 01:35 PM      Comment:      <1.00 log    HBVD10 3.56 (H) 02/01/2023 04:06 PM    HBVD10 2.27 (H) 08/28/2022 10:42 AM    HBEAG Negative 08/28/2022 10:42 AM    HBEAG Negative 11/26/2020 10:12 AM    HBEAG Negative 05/28/2020 09:58 AM  HBSAG (A) 08/10/2023 01:35 PM     For final results see Hepatitis B Surface Antigen Neutralization    HBSAG (A) 08/28/2022 10:42 AM     For final results see Hepatitis B Surface Antigen Neutralization    HBSAG (A) 11/26/2020 10:12 AM     For final results see Hepatitis B Surface Antigen Neutralization    HEPBSAB Nonreactive 08/10/2023 01:35 PM    HEPBSAB Nonreactive 05/28/2020 09:58 AM    HEPBCAB Reactive (A) 05/28/2020 09:58 AM    ALT 32 01/18/2024 04:06 PM    ALT 37 08/10/2023 01:35 PM    ALT 45 02/01/2023 04:06 PM    ALT 21 07/13/2011 04:07 PM    ALT 22 02/13/2011 10:49 AM    ALT 26 01/26/2011 02:41 PM    AST 29 01/18/2024 04:06 PM    AST 20 07/13/2011 04:07 PM    CREATININE 1.20 (H) 08/10/2023 01:35 PM    CREATININE 1.14 08/28/2022 10:42 AM    CREATININE 1.08 11/26/2020 10:12 AM    CREATININE 1.3 (H) 07/13/2011 04:07 PM    CREATININE 1.2 02/13/2011 10:49 AM    CREATININE 1.2 01/26/2011 02:41 PM         PATIENT SPECIFIC NEEDS Does the patient have any physical, cognitive, or cultural barriers? No    Is the patient high risk? No    Does the patient require physician intervention or other additional services (i.e., nutrition, smoking cessation, social work)? No    Does the patient have an additional or emergency contact listed in their chart? Yes    SOCIAL DETERMINANTS OF HEALTH     At the [facility], we have learned that life circumstances - like trouble affording food, housing, utilities, or transportation can affect the health of many of our patients.   That is why we wanted to ask: are you currently experiencing any life circumstances that are negatively impacting your health and/or quality of life? Patient declined to answer    Social Drivers of Health     Food Insecurity: Not on file   Tobacco Use: Low Risk  (01/18/2024)    Patient History     Smoking Tobacco Use: Never     Smokeless Tobacco Use: Never     Passive Exposure: Not on file   Transportation Needs: Not on file   Alcohol Use: Not on file   Housing: Not on file   Physical Activity: Not on file   Utilities: Not on file   Stress: Not on file   Interpersonal Safety: Not on file   Substance Use: Not on file (06/07/2023)   Intimate Partner Violence: Not on file   Social Connections: Not on file   Financial Resource Strain: Not on file   Health Literacy: Not on file   Internet Connectivity: Not on file       Would you be willing to receive help with any of the needs that you have identified today? Not applicable       SHIPPING     Specialty Medication(s) to be Shipped:   Infectious Disease: entecavir     Other medication(s) to be shipped: No additional medications requested for fill at this time     Changes to insurance: No    Cost and Payment: Patient has a copay of $53.40. They are aware and have authorized the pharmacy to charge the credit card on file.    Delivery Scheduled: Yes, Expected medication delivery date: 01/25/24.     Medication will be delivered via  UPS to the confirmed prescription address in [facility].    The patient will receive a drug information handout for each medication shipped and additional FDA Medication Guides as required.  Verified that patient has previously received a Conservation officer, historic buildings and a Surveyor, mining.    The patient or caregiver noted above participated in the development of this care plan and knows that they can request review of or adjustments to the care plan at any time.      All of the patient's questions and concerns have been addressed.    Aleck CHRISTELLA Gaskins, PharmD   [facility] Specialty Pharmacist       [1]   Current Outpatient Medications   Medication Sig Dispense Refill    amLODIPine (NORVASC) 10 MG tablet Take 1 tablet (10 mg total) by mouth daily.      aspirin (ECOTRIN) 81 MG tablet Take 1 tablet (81 mg total) by mouth daily.      atorvastatin (LIPITOR) 40 MG tablet Take 1 tablet (40 mg total) by mouth.      cholecalciferol, vitamin D3, (VITAMIN D3 ORAL) Take 1,000 Units by mouth every morning before breakfast.      entecavir  (BARACLUDE ) 0.5 MG tablet Take 1 tablet (0.5 mg total) by mouth daily. 90 tablet 3    multivitamin (TAB-A-VITE/THERAGRAN) per tablet Take 1 tablet by mouth daily.       No current facility-administered medications for this visit.   [2] No Known Allergies

## 2024-02-15 ENCOUNTER — Inpatient Hospital Stay: Admit: 2024-02-15 | Discharge: 2024-02-15 | Payer: PRIVATE HEALTH INSURANCE

## 2024-02-15 DIAGNOSIS — B181 Chronic viral hepatitis B without delta-agent: Principal | ICD-10-CM

## 2024-03-20 NOTE — Unmapped (Signed)
 [facility] - Clinical Pharmacist Intervention   Reason for Intervention Issue Observed Medication interaction          Additional Comments patient would like to start supplements: magnesium, Vit K2, nitric oxide   Intervention Type of Intervention Education performed  Provider contacted                  Recommendation provided, if applicable Advised patient to contact liver clinic before starting Nitric Oxide supplement, but no interactions or concerns with liver health with magnesium and Vit K2 supplements    Medication Medication(s) Involved Infectious Disease: entecavir      Outcome Expected Result/Outcome of Intervention Enhanced patient understanding    Additional Comments      Follow-up Needed? No    Additional Follow-up Comments     Supporting Information Approximate Time Spent on Intervention 0-10 minutes    Clinical Evidence Used Professional judgement  Clinical guidelines or Drug information resource

## 2024-04-17 NOTE — Unmapped (Signed)
 [facility] has made a second and final attempt to reach this patient to refill the following medication:entecavir .      We have left voicemail with patient at the following phone numbers: [phone], have been unable to leave messages on the following phone numbers: [phone], have sent a MyChart message, have sent a text message to the following phone numbers: [phone], and have sent a Mychart questionnaire..    Dates contacted: 04/12/2024  04/17/2024  Last scheduled delivery: 01/25/2024 Note 90 day supply    The patient may be at risk of non-compliance with this medication. The patient should call the [facility] at [phone]  Option 4, then Option 4: Infectious Disease, Transplant to refill medication.    Jeoffrey JAYSON Sherra UNK Karel armin Corean Jeppie Venson Karel Nathanael

## 2024-04-17 NOTE — Unmapped (Signed)
 [facility] Refill Coordination Note    Specialty Medication(s) to be Shipped:   Infectious Disease: entecavir     Other medication(s) to be shipped: No additional medications requested for fill at this time    Specialty Medications not needed at this time: N/A     Ralph Watson, DOB: [birth year]  Phone: [phone] (home)       All above HIPAA information was verified with patient.     Was a Nurse, learning disability used for this call? No    Completed refill call assessment today to schedule patient's medication shipment from the [facility]  [phone]).  All relevant notes have been reviewed.     Specialty medication(s) and dose(s) confirmed: Regimen is correct and unchanged.   Changes to medications: [name] reports no changes at this time.  Changes to insurance: No  New side effects reported not previously addressed with a pharmacist or physician: None reported  Questions for the pharmacist: No    Confirmed patient received a Conservation officer, historic buildings and a Surveyor, mining with first shipment. The patient will receive a drug information handout for each medication shipped and additional FDA Medication Guides as required.       DISEASE/MEDICATION-SPECIFIC INFORMATION        N/A    SPECIALTY MEDICATION ADHERENCE     Medication Adherence    Patient reported X missed doses in the last month: 0  Specialty Medication: Entecavir  0.5mg   Informant: patient              Were doses missed due to medication being on hold? No    Entecavir  0.5 mg: 14 days of medicine on hand       REFERRAL TO PHARMACIST     Referral to the pharmacist: Not needed      [facility]     Shipping address confirmed in Epic.     Cost and Payment: Patient has a copay of $~55. They are aware and have authorized the pharmacy to charge the credit card on file.    Delivery Scheduled: Yes, Expected medication delivery date: 05/02/24.     Medication will be delivered via UPS to the prescription address in Epic WAM.    Ralph Watson, PharmD   [facility]  Specialty Pharmacist

## 2024-05-01 MED FILL — ENTECAVIR 0.5 MG TABLET: ORAL | 90 days supply | Qty: 90 | Fill #1

## 2024-06-27 ENCOUNTER — Ambulatory Visit: Admit: 2024-06-27 | Discharge: 2024-06-28

## 2024-06-27 DIAGNOSIS — B181 Chronic viral hepatitis B without delta-agent: Principal | ICD-10-CM

## 2024-06-27 LAB — COMPREHENSIVE METABOLIC PANEL
ALBUMIN: 4.5 g/dL (ref 3.4–5.0)
ALKALINE PHOSPHATASE: 66 U/L (ref 46–116)
ALT (SGPT): 28 U/L (ref 10–49)
ANION GAP: 12 mmol/L (ref 5–14)
AST (SGOT): 27 U/L (ref <=34)
BILIRUBIN TOTAL: 1.8 mg/dL — ABNORMAL HIGH (ref 0.3–1.2)
BLOOD UREA NITROGEN: 11 mg/dL (ref 9–23)
BUN / CREAT RATIO: 10
CALCIUM: 9.5 mg/dL (ref 8.7–10.4)
CHLORIDE: 100 mmol/L (ref 98–107)
CO2: 30.7 mmol/L (ref 20.0–31.0)
CREATININE: 1.14 mg/dL (ref 0.73–1.18)
EGFR CKD-EPI (2021) MALE: 82 mL/min/1.73m2 (ref >=60)
GLUCOSE RANDOM: 114 mg/dL — ABNORMAL HIGH (ref 70–99)
POTASSIUM: 3.8 mmol/L (ref 3.4–4.8)
PROTEIN TOTAL: 7.8 g/dL (ref 5.7–8.2)
SODIUM: 143 mmol/L (ref 135–145)

## 2024-06-27 LAB — AFP TUMOR MARKER: AFP-TUMOR MARKER: 8 ng/mL (ref <=8)

## 2024-06-27 LAB — HEPATITIS B SURFACE ANTIGEN

## 2024-06-27 LAB — HEP.B NEUTRALIZATION: HBSAG NEUT: POSITIVE — AB

## 2024-06-27 NOTE — Progress Notes (Signed)
 Ralph Watson Ralph Watson  06/27/2024    Reason for visit: Follow up for Chronic viral hepatitis B without delta agent and without coma    (CMS-HCC) [B18.1]    Assessment/Plan:    43 y.o. male with chronic HBV who presents for follow-up.     HBV: diagnosed in high school; +family history. From [place]. HBeAb+ with low DNA. Started entecavir  02/09/2023. Fibroscan 05/2020 with LSM 9kPa (F2) and S2 steatosis.   - Continue entecavir  0.5mg  daily  - Monitor HBV DNA and ALT q24months   - Check HBsAg/sAb annually for functional cure  Metabolic syndrome: obesity, HTN, HLD. We have discussed weight gain in the past. Has gained + 20 lbs since last visit. Discussed importance of maintaining healthy weight and controlling metabolic RF for MASLD  - We had previously referred him to dietician but he was unable to make an appt fit into his work schedule.   - continue to reinforce weight loss  HCC surveillance: q52month screening discussed. 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  Health maintenance:   Colon cancer screening: due age 71        Return in about 6 months (around 12/26/2024).      Subjective   History of Present Illness   Accompanied by: N/A (unaccompanied)    43 y.o. male with chronic HBV who presents for follow-up.    Last visit: 12/2023  History of Present Illness    Ralph Watson is a 43 year old male with hepatitis B who presents for routine follow-up.    Chronic hepatitis b management  - Diagnosed with chronic hepatitis B  - Taking entecavir  daily without missed doses  - Inquired about taking amlodipine concurrently with entecavir   - No recent travel  - No alcohol consumption  - No recent or past drug use  - Family, including wife and children, vaccinated against hepatitis B    Abdominal symptoms  - Experienced bilateral abdominal 'pinches' approximately one month ago, now resolved  - Uncertain if pain was musculoskeletal or related to gas  - No current abdominal pain    Weight loss and nutritional status  - regained 20 lbs that he had initially lost   - Has not seen a nutritionist but feels he is managing his diet well    Gilbert's syndrome and hyperbilirubinemia  - Known diagnosis of Gilbert's syndrome causing occasional elevated bilirubin levels  - No symptoms of jaundice, including yellow eyes or skin    Medication regimen  - Current medications include entecavir , amlodipine, atorvastatin, vitamin D, multivitamin, and occasional magnesium         Objective   Physical Exam   Vital Signs: BP 146/92 (BP Position: Sitting)  - Pulse 96  - Temp 36.1 ??C (97 ??F)  - Ht 175.3 cm (5' 9.02)  - Wt (!) 102.5 kg (226 lb)  - SpO2 100%  - BMI 33.36 kg/m??   Constitutional: He is in no apparent distress  Eyes: Anicteric sclerae  Cardiovascular: No peripheral edema  Gastrointestinal: Non-distended abdomen  Neurologic: Awake, alert, and oriented to person, place, and time with normal speech     Data:  Office Visit on 06/27/2024   Component Date Value Ref Range Status    Hep B Surface Ag 06/27/2024 For final results see Hepatitis B Surface Antigen Neutralization (A)  Nonreactive Final    AFP-Tumor Marker 06/27/2024 8  <=8 ng/mL  Final    Sodium 06/27/2024 143  135 - 145 mmol/L Final    Potassium 06/27/2024 3.8  3.4 - 4.8 mmol/L Final    Chloride 06/27/2024 100  98 - 107 mmol/L Final    CO2 06/27/2024 30.7  20.0 - 31.0 mmol/L Final    Anion Gap 06/27/2024 12  5 - 14 mmol/L Final    BUN 06/27/2024 11  9 - 23 mg/dL Final    Creatinine 87/97/7974 1.14  0.73 - 1.18 mg/dL Final    BUN/Creatinine Ratio 06/27/2024 10   Final    eGFR CKD-EPI (2021) Male 06/27/2024 82  >=60 mL/min/1.69m2 Final    Glucose 06/27/2024 114 (H)  70 - 99 mg/dL Final    Calcium 87/97/7974 9.5  8.7 - 10.4 mg/dL Final    Albumin 87/97/7974 4.5  3.4 - 5.0 g/dL Final    Total Protein 06/27/2024 7.8  5.7 - 8.2 g/dL Final    Total Bilirubin 06/27/2024 1.8 (H)  0.3 - 1.2 mg/dL Final    AST 87/97/7974 27  <=34 U/L Final    ALT 06/27/2024 28  10 - 49 U/L Final    Alkaline Phosphatase 06/27/2024 66  46 - 116 U/L Final    Hep B S Ag Neutralization 06/27/2024 Hepatitis B Surface Antigen POSITIVE;confirmed by neutralization (A)  Reactive by screening assay;non-confirmable by neutralization assay Final      US  abdomen 02/15/2024 : no focal Ralph lesion

## 2024-06-28 LAB — HEPATITIS B DNA, QUANTITATIVE, PCR: HBV DNA QUANT: NOT DETECTED

## 2024-07-31 MED FILL — ENTECAVIR 0.5 MG TABLET: ORAL | 90 days supply | Qty: 90 | Fill #2

## 2024-07-31 NOTE — Progress Notes (Signed)
 [facility] Refill Coordination Note    Specialty Medication(s) to be Shipped:   Infectious Disease: entecavir     Other medication(s) to be shipped: No additional medications requested for fill at this time    Specialty Medications not needed at this time: N/A     Ralph Watson, DOB: [birth year]  Phone: [phone] (home)       All above HIPAA information was verified with patient.     Was a nurse, learning disability used for this call? No    Completed refill call assessment today to schedule patient's medication shipment from the [facility]  [phone]).  All relevant notes have been reviewed.     Specialty medication(s) and dose(s) confirmed: Regimen is correct and unchanged.   Changes to medications: Ralph Watson reports no changes at this time.  Changes to insurance: No  New side effects reported not previously addressed with a pharmacist or physician: None reported  Questions for the pharmacist: No    Confirmed patient received a Conservation Officer, Historic Buildings and a Surveyor, Mining with first shipment. The patient will receive a drug information handout for each medication shipped and additional FDA Medication Guides as required.       DISEASE/MEDICATION-SPECIFIC INFORMATION        N/A    SPECIALTY MEDICATION ADHERENCE              Were doses missed due to medication being on hold? No    entecavir  0.5mg  : 3 days of medicine on hand     Specialty medication is an injection or given on a cycle: No    REFERRAL TO PHARMACIST     Referral to the pharmacist: Not needed      [facility]     Shipping address confirmed in Epic.     Cost and Payment: Patient has a copay of $53.35. They are aware and have authorized the pharmacy to charge the credit card on file.    Delivery Scheduled: Yes, Expected medication delivery date: 08/01/2024.     Medication will be delivered via UPS to the prescription address in Epic WAM.    Ralph Watson, PharmD   [facility] Specialty Pharmacist

## 2024-08-17 ENCOUNTER — Inpatient Hospital Stay: Admit: 2024-08-17 | Discharge: 2024-08-17 | Payer: BLUE CROSS/BLUE SHIELD

## 2024-08-18 DIAGNOSIS — B181 Chronic viral hepatitis B without delta-agent: Principal | ICD-10-CM

## 2024-08-24 ENCOUNTER — Ambulatory Visit: Admit: 2024-08-24 | Payer: BLUE CROSS/BLUE SHIELD

## 2024-08-26 DIAGNOSIS — B181 Chronic viral hepatitis B without delta-agent: Secondary | ICD-10-CM

## 2024-08-26 DIAGNOSIS — Z1289 Encounter for screening for malignant neoplasm of other sites: Principal | ICD-10-CM

## 2024-08-26 NOTE — Telephone Encounter (Addendum)
 Follow-up on in-basket below  Chart reviewed--> MRI abdomen authorization was obtained by pre-arrival--> servicing location changed from [facility] to [facility] in the [facility] Provider Portal   MRI order faxed with the prior authorization information, recent ultrasound results & patient's facesheet to [facility]    Almarie RN    ----- Message from Manuelita Dickens, AGNP sent at 08/21/2024  4:22 PM EST -----  Regarding: mri   hey!  can you send the MRI i ordered to [facility] - it is cheaper for him.     thanks !    lindsay
